# Patient Record
Sex: Male | Born: 1950
Health system: Southern US, Community
[De-identification: ages and names within clinical notes are randomized; demographics above are authoritative.]

## PROBLEM LIST (undated history)

## (undated) DIAGNOSIS — F419 Anxiety disorder, unspecified: Secondary | ICD-10-CM

## (undated) DIAGNOSIS — K7460 Unspecified cirrhosis of liver: Secondary | ICD-10-CM

## (undated) DIAGNOSIS — D649 Anemia, unspecified: Secondary | ICD-10-CM

## (undated) DIAGNOSIS — F32A Depression, unspecified: Secondary | ICD-10-CM

## (undated) DIAGNOSIS — H269 Unspecified cataract: Secondary | ICD-10-CM

## (undated) DIAGNOSIS — N289 Disorder of kidney and ureter, unspecified: Secondary | ICD-10-CM

## (undated) DIAGNOSIS — F1910 Other psychoactive substance abuse, uncomplicated: Secondary | ICD-10-CM

## (undated) DIAGNOSIS — Z5189 Encounter for other specified aftercare: Secondary | ICD-10-CM

## (undated) DIAGNOSIS — C220 Liver cell carcinoma: Secondary | ICD-10-CM

## (undated) DIAGNOSIS — I10 Essential (primary) hypertension: Secondary | ICD-10-CM

## (undated) DIAGNOSIS — A159 Respiratory tuberculosis unspecified: Secondary | ICD-10-CM

## (undated) DIAGNOSIS — M1990 Unspecified osteoarthritis, unspecified site: Secondary | ICD-10-CM

## (undated) DIAGNOSIS — K219 Gastro-esophageal reflux disease without esophagitis: Secondary | ICD-10-CM

## (undated) DIAGNOSIS — C801 Malignant (primary) neoplasm, unspecified: Secondary | ICD-10-CM

## (undated) DIAGNOSIS — K922 Gastrointestinal hemorrhage, unspecified: Secondary | ICD-10-CM

## (undated) DIAGNOSIS — F329 Major depressive disorder, single episode, unspecified: Secondary | ICD-10-CM

## (undated) HISTORY — DX: Respiratory tuberculosis unspecified: A15.9

## (undated) HISTORY — DX: Gastro-esophageal reflux disease without esophagitis: K21.9

## (undated) HISTORY — DX: Major depressive disorder, single episode, unspecified: F32.9

## (undated) HISTORY — PX: THROAT SURGERY: SHX803

## (undated) HISTORY — DX: Malignant (primary) neoplasm, unspecified: C80.1

## (undated) HISTORY — DX: Encounter for other specified aftercare: Z51.89

## (undated) HISTORY — DX: Unspecified osteoarthritis, unspecified site: M19.90

## (undated) HISTORY — DX: Other psychoactive substance abuse, uncomplicated: F19.10

## (undated) HISTORY — DX: Liver cell carcinoma: C22.0

## (undated) HISTORY — DX: Unspecified cataract: H26.9

## (undated) HISTORY — PX: ABDOMINAL SURGERY: SHX537

## (undated) HISTORY — DX: Depression, unspecified: F32.A

## (undated) HISTORY — DX: Essential (primary) hypertension: I10

## (undated) HISTORY — DX: Anxiety disorder, unspecified: F41.9

---

## 2001-10-30 ENCOUNTER — Emergency Department (HOSPITAL_COMMUNITY): Admission: EM | Admit: 2001-10-30 | Discharge: 2001-10-30 | Payer: Self-pay | Admitting: Emergency Medicine

## 2001-10-30 ENCOUNTER — Encounter: Payer: Self-pay | Admitting: Emergency Medicine

## 2008-09-26 DIAGNOSIS — D649 Anemia, unspecified: Secondary | ICD-10-CM | POA: Insufficient documentation

## 2008-09-26 HISTORY — DX: Anemia, unspecified: D64.9

## 2008-09-28 DIAGNOSIS — Z720 Tobacco use: Secondary | ICD-10-CM | POA: Insufficient documentation

## 2008-10-10 DIAGNOSIS — R188 Other ascites: Secondary | ICD-10-CM

## 2008-10-10 DIAGNOSIS — D696 Thrombocytopenia, unspecified: Secondary | ICD-10-CM

## 2008-11-15 DIAGNOSIS — K921 Melena: Secondary | ICD-10-CM

## 2008-11-23 DIAGNOSIS — K2270 Barrett's esophagus without dysplasia: Secondary | ICD-10-CM | POA: Insufficient documentation

## 2009-07-13 DIAGNOSIS — K2980 Duodenitis without bleeding: Secondary | ICD-10-CM

## 2011-01-29 HISTORY — PX: EYE SURGERY: SHX253

## 2012-07-04 DIAGNOSIS — F1010 Alcohol abuse, uncomplicated: Secondary | ICD-10-CM | POA: Insufficient documentation

## 2012-07-04 DIAGNOSIS — E119 Type 2 diabetes mellitus without complications: Secondary | ICD-10-CM | POA: Insufficient documentation

## 2012-07-04 DIAGNOSIS — I8500 Esophageal varices without bleeding: Secondary | ICD-10-CM | POA: Insufficient documentation

## 2013-03-08 ENCOUNTER — Encounter (HOSPITAL_COMMUNITY): Payer: Self-pay | Admitting: Emergency Medicine

## 2013-03-08 ENCOUNTER — Emergency Department (HOSPITAL_COMMUNITY)
Admission: EM | Admit: 2013-03-08 | Discharge: 2013-03-08 | Disposition: A | Discharge status: Home / Self Care | Payer: Medicare HMO | Attending: Emergency Medicine | Admitting: Emergency Medicine

## 2013-03-08 DIAGNOSIS — F17200 Nicotine dependence, unspecified, uncomplicated: Secondary | ICD-10-CM | POA: Insufficient documentation

## 2013-03-08 DIAGNOSIS — K7460 Unspecified cirrhosis of liver: Secondary | ICD-10-CM | POA: Insufficient documentation

## 2013-03-08 DIAGNOSIS — Z79899 Other long term (current) drug therapy: Secondary | ICD-10-CM | POA: Insufficient documentation

## 2013-03-08 DIAGNOSIS — K859 Acute pancreatitis without necrosis or infection, unspecified: Secondary | ICD-10-CM | POA: Insufficient documentation

## 2013-03-08 DIAGNOSIS — Z888 Allergy status to other drugs, medicaments and biological substances status: Secondary | ICD-10-CM | POA: Insufficient documentation

## 2013-03-08 DIAGNOSIS — Z8719 Personal history of other diseases of the digestive system: Secondary | ICD-10-CM | POA: Insufficient documentation

## 2013-03-08 DIAGNOSIS — Z880 Allergy status to penicillin: Secondary | ICD-10-CM | POA: Insufficient documentation

## 2013-03-08 HISTORY — DX: Gastrointestinal hemorrhage, unspecified: K92.2

## 2013-03-08 HISTORY — DX: Unspecified cirrhosis of liver: K74.60

## 2013-03-08 HISTORY — DX: Disorder of kidney and ureter, unspecified: N28.9

## 2013-03-08 LAB — CBC WITH DIFFERENTIAL/PLATELET
Basophils Absolute: 0 10*3/uL (ref 0.0–0.1)
Basophils Relative: 0 % (ref 0–1)
Eosinophils Absolute: 0.5 10*3/uL (ref 0.0–0.7)
Eosinophils Relative: 11 % — ABNORMAL HIGH (ref 0–5)
HCT: 34.4 % — ABNORMAL LOW (ref 39.0–52.0)
Hemoglobin: 12.6 g/dL — ABNORMAL LOW (ref 13.0–17.0)
Lymphocytes Relative: 26 % (ref 12–46)
Lymphs Abs: 1.2 10*3/uL (ref 0.7–4.0)
MCH: 32.5 pg (ref 26.0–34.0)
MCHC: 36.6 g/dL — ABNORMAL HIGH (ref 30.0–36.0)
MCV: 88.7 fL (ref 78.0–100.0)
Monocytes Absolute: 0.4 10*3/uL (ref 0.1–1.0)
Monocytes Relative: 9 % (ref 3–12)
Neutro Abs: 2.6 10*3/uL (ref 1.7–7.7)
Neutrophils Relative %: 54 % (ref 43–77)
Platelets: 95 10*3/uL — ABNORMAL LOW (ref 150–400)
RBC: 3.88 MIL/uL — ABNORMAL LOW (ref 4.22–5.81)
RDW: 14.3 % (ref 11.5–15.5)
WBC: 4.7 10*3/uL (ref 4.0–10.5)

## 2013-03-08 LAB — COMPREHENSIVE METABOLIC PANEL
ALT: 28 U/L (ref 0–53)
AST: 53 U/L — ABNORMAL HIGH (ref 0–37)
Albumin: 3.9 g/dL (ref 3.5–5.2)
Alkaline Phosphatase: 122 U/L — ABNORMAL HIGH (ref 39–117)
BUN: 11 mg/dL (ref 6–23)
CO2: 20 mEq/L (ref 19–32)
Calcium: 10.5 mg/dL (ref 8.4–10.5)
Chloride: 92 mEq/L — ABNORMAL LOW (ref 96–112)
Creatinine, Ser: 1.11 mg/dL (ref 0.50–1.35)
GFR calc Af Amer: 79 mL/min — ABNORMAL LOW (ref >90)
GFR calc non Af Amer: 68 mL/min — ABNORMAL LOW (ref >90)
Glucose, Bld: 107 mg/dL — ABNORMAL HIGH (ref 70–99)
Potassium: 4.8 mEq/L (ref 3.7–5.3)
Sodium: 127 mEq/L — ABNORMAL LOW (ref 137–147)
Total Bilirubin: 0.9 mg/dL (ref 0.3–1.2)
Total Protein: 8.7 g/dL — ABNORMAL HIGH (ref 6.0–8.3)

## 2013-03-08 LAB — URINALYSIS, ROUTINE W REFLEX MICROSCOPIC
Bilirubin Urine: NEGATIVE
Glucose, UA: NEGATIVE mg/dL
Hgb urine dipstick: NEGATIVE
Ketones, ur: NEGATIVE mg/dL
Leukocytes, UA: NEGATIVE
Nitrite: NEGATIVE
Protein, ur: NEGATIVE mg/dL
Specific Gravity, Urine: 1.005 (ref 1.005–1.030)
Urobilinogen, UA: 1 mg/dL (ref 0.0–1.0)
pH: 5.5 (ref 5.0–8.0)

## 2013-03-08 LAB — LIPASE, BLOOD: Lipase: 68 U/L — ABNORMAL HIGH (ref 11–59)

## 2013-03-08 MED ORDER — OXYCODONE HCL 5 MG PO TABS: 5.0000 mg | ORAL_TABLET | Freq: Four times a day (QID) | ORAL | Status: DC | PRN

## 2013-03-08 MED ORDER — ONDANSETRON 4 MG PO TBDP
4.0000 mg | ORAL_TABLET | Freq: Once | ORAL | Status: AC
  Administered 2013-03-08: 4 mg via ORAL
  Filled 2013-03-08: qty 1

## 2013-03-08 MED ORDER — OXYCODONE-ACETAMINOPHEN 5-325 MG PO TABS
2.0000 | ORAL_TABLET | Freq: Once | ORAL | Status: AC
  Administered 2013-03-08: 2 via ORAL
  Filled 2013-03-08: qty 2

## 2013-03-08 NOTE — Discharge Instructions (Signed)
Acute Pancreatitis °Acute pancreatitis is a disease in which the pancreas becomes suddenly inflamed. The pancreas is a large gland located behind your stomach. The pancreas produces enzymes that help digest food. The pancreas also releases the hormones glucagon and insulin that help regulate blood sugar. Damage to the pancreas occurs when the digestive enzymes from the pancreas are activated and begin attacking the pancreas before being released into the intestine. Most acute attacks last a couple of days and can cause serious complications. Some people become dehydrated and develop low blood pressure. In severe cases, bleeding into the pancreas can lead to shock and can be life-threatening. The lungs, heart, and kidneys may fail. °CAUSES  °Pancreatitis can happen to anyone. In some cases, the cause is unknown. Most cases are caused by: °· Alcohol abuse. °· Gallstones. °Other less common causes are: °· Certain medicines. °· Exposure to certain chemicals. °· Infection. °· Damage caused by an accident (trauma). °· Abdominal surgery. °SYMPTOMS  °· Pain in the upper abdomen that may radiate to the back. °· Tenderness and swelling of the abdomen. °· Nausea and vomiting. °DIAGNOSIS  °Your caregiver will perform a physical exam. Blood and stool tests may be done to confirm the diagnosis. Imaging tests may also be done, such as X-rays, CT scans, or an ultrasound of the abdomen. °TREATMENT  °Treatment usually requires a stay in the hospital. Treatment may include: °· Pain medicine. °· Fluid replacement through an intravenous line (IV). °· Placing a tube in the stomach to remove stomach contents and control vomiting. °· Not eating for 3 or 4 days. This gives your pancreas a rest, because enzymes are not being produced that can cause further damage. °· Antibiotic medicines if your condition is caused by an infection. °· Surgery of the pancreas or gallbladder. °HOME CARE INSTRUCTIONS  °· Follow the diet advised by your  caregiver. This may involve avoiding alcohol and decreasing the amount of fat in your diet. °· Eat smaller, more frequent meals. This reduces the amount of digestive juices the pancreas produces. °· Drink enough fluids to keep your urine clear or pale yellow. °· Only take over-the-counter or prescription medicines as directed by your caregiver. °· Avoid drinking alcohol if it caused your condition. °· Do not smoke. °· Get plenty of rest. °· Check your blood sugar at home as directed by your caregiver. °· Keep all follow-up appointments as directed by your caregiver. °SEEK MEDICAL CARE IF:  °· You do not recover as quickly as expected. °· You develop new or worsening symptoms. °· You have persistent pain, weakness, or nausea. °· You recover and then have another episode of pain. °SEEK IMMEDIATE MEDICAL CARE IF:  °· You are unable to eat or keep fluids down. °· Your pain becomes severe. °· You have a fever or persistent symptoms for more than 2 to 3 days. °· You have a fever and your symptoms suddenly get worse. °· Your skin or the white part of your eyes turn yellow (jaundice). °· You develop vomiting. °· You feel dizzy, or you faint. °· Your blood sugar is high (over 300 mg/dL). °MAKE SURE YOU:  °· Understand these instructions. °· Will watch your condition. °· Will get help right away if you are not doing well or get worse. °Document Released: 01/14/2005 Document Revised: 07/16/2011 Document Reviewed: 04/25/2011 °ExitCare® Patient Information ©2014 [employer]. ° °

## 2013-03-08 NOTE — ED Notes (Addendum)
C/o lower mid abd pain, 8/10, onset 2d ago, also nausea. Last BM yesterday (normal), (denies: fever, diarrhea or vomiting), h/o cirrhosis & CKD. Here with family. walkes with cane. H/o GI bleed.

## 2013-03-08 NOTE — ED Notes (Signed)
Pt given a ginger ale for PO fluid challenge per EDP verbal orders.

## 2013-03-08 NOTE — ED Provider Notes (Signed)
CSN: [id number]     Arrival date & time 03/08/13  0025 History   First MD Initiated Contact with Patient 03/08/13 [phone]     Chief Complaint  Patient presents with  . Abdominal Pain   (Consider location/radiation/quality/duration/timing/severity/associated sxs/prior Treatment) Patient is a 63 y.o. male presenting with abdominal pain. The history is provided by the patient.  Abdominal Pain Pain location:  Suprapubic Pain quality: aching   Pain radiates to:  Does not radiate Pain severity:  Moderate Onset quality:  Gradual Duration:  2 days Timing:  Constant Progression:  Unchanged Context: not awakening from sleep   Relieved by:  Nothing Worsened by:  Nothing tried Ineffective treatments:  None tried Associated symptoms: no anorexia, no hematuria, no nausea, no shortness of breath and no vomiting   Risk factors: not pregnant     Past Medical History  Diagnosis Date  . Cirrhosis   . Renal disorder   . GI bleed    Past Surgical History  Procedure Laterality Date  . Abdominal surgery      band around gut at [facility]   No family history on file. History  Substance Use Topics  . Smoking status: Current Some Day Smoker  . Smokeless tobacco: Not on file  . Alcohol Use: Yes    Review of Systems  Respiratory: Negative for shortness of breath.   Gastrointestinal: Positive for abdominal pain. Negative for nausea, vomiting and anorexia.  Genitourinary: Negative for hematuria.  All other systems reviewed and are negative.    Allergies  Penicillins and Tylenol  Home Medications   Current Outpatient Rx  Name  Route  Sig  Dispense  Refill  . furosemide (LASIX) 40 MG tablet   Oral   Take 1 mg by mouth daily.          Marland Kitchen omeprazole (PRILOSEC) 20 MG capsule   Oral   Take 20 mg by mouth daily.          . propranolol (INDERAL) 10 MG tablet   Oral   Take 1 tablet by mouth 2 (two) times daily.         Marland Kitchen spironolactone (ALDACTONE) 50 MG tablet   Oral   Take 1  tablet by mouth 2 (two) times daily.          BP 98/61  Pulse 60  Temp(Src) 97.7 F (36.5 C) (Oral)  Resp 18  SpO2 100% Physical Exam  Constitutional: He is oriented to person, place, and time. He appears well-developed and well-nourished. No distress.  HENT:  Head: Normocephalic and atraumatic.  Mouth/Throat: Oropharynx is clear and moist.  Eyes: Conjunctivae are normal. Pupils are equal, round, and reactive to light.  Neck: Normal range of motion. Neck supple.  Cardiovascular: Normal rate, regular rhythm and intact distal pulses.   Pulmonary/Chest: Effort normal and breath sounds normal. He has no wheezes. He has no rales.  Abdominal: Soft. Bowel sounds are increased. There is no tenderness. There is no guarding.  Normal appearance  Musculoskeletal: Normal range of motion.  Neurological: He is alert and oriented to person, place, and time.  Skin: Skin is warm and dry.  Psychiatric: He has a normal mood and affect.    ED Course  Procedures (including critical care time) Labs Review Labs Reviewed  CBC WITH DIFFERENTIAL - Abnormal; Notable for the following:    RBC 3.88 (*)    Hemoglobin 12.6 (*)    HCT 34.4 (*)    MCHC 36.6 (*)    Platelets  95 (*)    Eosinophils Relative 11 (*)    All other components within normal limits  COMPREHENSIVE METABOLIC PANEL - Abnormal; Notable for the following:    Sodium 127 (*)    Chloride 92 (*)    Glucose, Bld 107 (*)    Total Protein 8.7 (*)    AST 53 (*)    Alkaline Phosphatase 122 (*)    GFR calc non Af Amer 68 (*)    GFR calc Af Amer 79 (*)    All other components within normal limits  LIPASE, BLOOD - Abnormal; Notable for the following:    Lipase 68 (*)    All other components within normal limits  URINALYSIS, ROUTINE W REFLEX MICROSCOPIC   Imaging Review No results found.  EKG Interpretation   None       MDM  No diagnosis found. Slight elevation of lipase will treat as outpatient for pancreatitis.  Holding down  PO.  Will d/c with pain medication.  No indication for CT at this time.  Follow up with your PMD and GI return for worsening symptoms    Rylinn Linzy K Devann Cribb-Rasch, MD 03/08/13 [phone]

## 2013-11-29 DIAGNOSIS — F419 Anxiety disorder, unspecified: Secondary | ICD-10-CM | POA: Insufficient documentation

## 2014-01-03 DIAGNOSIS — F329 Major depressive disorder, single episode, unspecified: Secondary | ICD-10-CM | POA: Insufficient documentation

## 2014-01-03 DIAGNOSIS — F32A Depression, unspecified: Secondary | ICD-10-CM | POA: Insufficient documentation

## 2014-07-05 NOTE — Patient Outreach (Signed)
[place] [facility]) Care Management  07/05/2014  Rigoberto Repass [birth year] [id number]   [employer] [facility] [employer] RN Collier Salina Gord) phoned [facility] requesting [facility] outreach patient for care, assigned Quinn Plowman, RN  Ronnell [place]. [place], [place] Management [place] Assistant Phone: [phone] Fax: [phone]

## 2014-07-13 ENCOUNTER — Other Ambulatory Visit: Payer: Self-pay

## 2014-07-13 NOTE — Patient Outreach (Signed)
[place] [facility]) Care Management  07/13/2014  Antonio Fleming [birth year] [id number]   Telephone call to patient/ daughter regarding [facility] referral .  Unable to reach.  HIPAA compliant voice message left with call back phone number. Telephone call to Dr. Jonathon Jordan office to verify patients home disposition.  Left message with nurse for call back. Telephone call to [name] with [facility] at home requesting call back to [facility].   PLAN; RNCM will attempt 2nd telephone outreach with 1 week.  Quinn Plowman RN,BSN,CCM [place] Coordinator [phone]

## 2014-07-13 NOTE — Patient Outreach (Signed)
[place] [facility]) Care Management  07/13/2014  Antonio Fleming [birth year] [id number]   Received return call from [name] with [employer] at [employer].  [name] states he previously followed patient for care coordination.  States last time speaking with Mr. Champine was July 05, 2014.  [name] gave RNCM patients new contact phone number:  [phone].  [name] attempted to reach patient while on the line with this RNCM.  Unable to reach patient and HIPAA compliant voice message left.  PLAN: RNCM will attempt to contact patient at new number within 1 week. RNCM will notify Lurline Del with [facility] care management of patient new number and request update in EPIC system.  Quinn Plowman RN,BSN,CCM [place] Coordinator [phone]

## 2014-07-14 ENCOUNTER — Other Ambulatory Visit: Payer: Self-pay

## 2014-07-14 NOTE — Patient Outreach (Signed)
[place] [facility]) Care Management  07/14/2014  Chandlar Guice 1950-05-01 [id number]   Voice message received from [facility] Smart family medicine at [facility] stating patient has not been seen iin their office for quite a while.  Ms. Alroy Dust states patient is not being seen at their office.   PLAN; No further follow up needed at this primary MD office.   Quinn Plowman RN,BSN,CCM [place] Coordinator [phone]

## 2014-07-18 ENCOUNTER — Other Ambulatory Visit: Payer: Self-pay

## 2014-07-18 NOTE — Patient Outreach (Addendum)
[place] [facility]) Care Management  07/18/2014  Antonio Fleming 06/26/1950 [id number]  SUBJECTIVE:  Telephone call to patient regarding [employer] [facility] [employer] referral.  HIPAA verified with patient.  Patient states he was recently discharged from [facility] rehabilitation facility. Patient states he fell.  States he was admitted to [facility] then transferred to [facility].  Patient unable to provide any additional detail.  Patient states he followed up with a doctor in [place] but will not be going to him for primary care.  Patient states he is going to call [facility] primary care to see if they are accepting new patients.  Patient states he was given a refill on his medications at discharge from [facility].  Patient states he is presently taking pain medication for liver cancer.  Patient states he is not followed by an oncologist. Patient states he is also taking a fluid pill but does not know why. Patient states he is able to obtain and afford his medications.  Patient in agreement to receive call back from this [facility] for follow up regarding primary MD appointment.   ASSESSMENT;  Referral per [name] with [facility] at Home.  Difficulty hearing patient on his phone.  Requested patient to repeat himself on several occassions. Patient poor historian.  Patient unable to articulate any medical conditions other than cancer.   PLAN: RNCM will follow up with patient within 3 business days.  Quinn Plowman RN,BSN,CCM [place] Coordinator [phone]

## 2014-07-20 ENCOUNTER — Other Ambulatory Visit: Payer: Self-pay

## 2014-07-20 NOTE — Patient Outreach (Signed)
[place] [facility]) Care Management  07/20/2014  Antonio Fleming Jun 15, 1950 [id number]   SUBJECTIVE;  Telephone call to patient for screening follow up.  HIPAA confirmed with patient.  Patient states she was unsuccessful at finding a primary MD.  This RNCM gave patient phone number to physician referral line and requested patient to call and inquire of new primary MD.   [facility] requested patient call back to notify of physician arrangements.  Patient verbalized understanding. Patient states previous physician he went to was after discharge from skilled nursing facility 2016.  Patient states he Patient states prevous physician had concern due to his sobriety issues. Patient reports he is still drinking beer. Patient states he has issues with transportation. Patient states he has to "bumb" a ride wherever he goes. Patient states he has liver cancer but is unaware of how it has progressed. Patient states he has not been to an oncologist in over a year.  Patient unable to states name of oncologist.  Patient states he has problems with his stomach and with bleeding due to his stomach issues.   ASSESSMENT: Care coordination assistance   PLAN:  RNCM will call patient within 1 day to assist with further needs. RNCM gave patient contact phone number to physician referral line. Patient will call physician referral line to establish a new primary provider.    Quinn Plowman RN,BSN,CCM [place] Coordinator [phone]

## 2014-07-21 ENCOUNTER — Other Ambulatory Visit: Payer: Commercial Managed Care - HMO

## 2014-07-21 NOTE — Patient Outreach (Signed)
[place] [facility]) Care Management  07/21/2014  Antonio Fleming 05/31/1950 [id number]   Telephone call to patient regarding primary MD referral.  Unable to reach patient. HIPAA compliant voice message left with call back phone number.   PLAN:  RNCM will attempt outreach to patient within 3 business days.   Quinn Plowman RN,BSN,CCM [place] Coordinator [phone]

## 2014-07-25 ENCOUNTER — Other Ambulatory Visit: Payer: Commercial Managed Care - HMO

## 2014-07-25 NOTE — Patient Outreach (Signed)
[facility]) Care Management  07/25/2014  Babe Clenney [birth year] [id number]   Second telephone outreach to patient regarding primary MD referral.  Unable to reach patient. HIPAA compliant voice message left with call back phone number.   PLAN: RNCM will attempt 3rd telephone outreach to patient within 2 business days.   Quinn Plowman RN,BSN,CCM [place] Coordinator [phone]

## 2014-07-26 ENCOUNTER — Other Ambulatory Visit: Payer: Commercial Managed Care - HMO

## 2014-07-26 NOTE — Patient Outreach (Signed)
[place] [facility]) Care Management  07/26/2014  Dorris Vangorder 1950/05/12 [id number]   Third telephone call to patient regarding [employer] referral.  Unable to reach patient. HIPAA compliant voice message left with call back phone number.  PLAN: RNCM will send patient outreach letter to attempt contact.  Quinn Plowman RN,BSN,CCM [place] Coordinator [phone]

## 2014-08-02 ENCOUNTER — Other Ambulatory Visit: Payer: Commercial Managed Care - HMO

## 2014-08-02 NOTE — Patient Outreach (Addendum)
[place] [facility]) Care Management  08/02/2014  Terrance Usery 05/22/1950 [id number]  Voice message left from patient that he has scheduled an appointment with Dr. Mackie Pai on 08/03/14. Telephone call to patient for referral follow up.  Unable to reach patient. HIPAA compliant voice message left with call back phone number.   PLAN: RNCM will attempt telephone outreach to patient within 3 business days. * addendum:  RNCM will send outreach letter to attempt contact with patient.  No additional telephone outreaches needed at this time.   Quinn Plowman RN,BSN,CCM [place] Coordinator [phone]

## 2014-08-03 ENCOUNTER — Encounter: Payer: Self-pay | Admitting: Medical

## 2014-08-03 ENCOUNTER — Ambulatory Visit (INDEPENDENT_AMBULATORY_CARE_PROVIDER_SITE_OTHER): Payer: Commercial Managed Care - HMO | Admitting: Medical

## 2014-08-03 VITALS — BP 119/71 | HR 63 | Temp 97.5°F | Ht 65.75 in | Wt 118.6 lb

## 2014-08-03 DIAGNOSIS — C229 Malignant neoplasm of liver, not specified as primary or secondary: Secondary | ICD-10-CM | POA: Insufficient documentation

## 2014-08-03 DIAGNOSIS — K7460 Unspecified cirrhosis of liver: Secondary | ICD-10-CM | POA: Insufficient documentation

## 2014-08-03 DIAGNOSIS — Z8619 Personal history of other infectious and parasitic diseases: Secondary | ICD-10-CM | POA: Diagnosis not present

## 2014-08-03 DIAGNOSIS — G894 Chronic pain syndrome: Secondary | ICD-10-CM | POA: Diagnosis not present

## 2014-08-03 DIAGNOSIS — C228 Malignant neoplasm of liver, primary, unspecified as to type: Secondary | ICD-10-CM | POA: Diagnosis not present

## 2014-08-03 DIAGNOSIS — K7469 Other cirrhosis of liver: Secondary | ICD-10-CM

## 2014-08-03 MED ORDER — OXYCODONE HCL 5 MG PO TABS: 5.0000 mg | ORAL_TABLET | Freq: Four times a day (QID) | ORAL | Status: DC | PRN

## 2014-08-03 NOTE — Assessment & Plan Note (Signed)
Ask pt to get me name of oncologist so I can get records and make sure he is referred back to GI in timely manner.

## 2014-08-03 NOTE — Progress Notes (Signed)
Subjective:    Patient ID: Antonio Fleming, male    DOB: [birth year], 64 y.o.   MRN: [id number]  HPI  I have reviewed pt PMH, PSH, FH, Social History and Surgical History.  Pt does not have primary care doctor- Pt could not understand prior pcp. States former [place] not from [place]   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  Alcohol use- 3 beers at a time. 24 beers spread out over a month. But prior drank more/heavy. Pt has cancer of the liver(Pt states he was told this 15 months ago). Pt has malignant neoplasm of liver.  Before this he had cirrhosis of the liver. Pt told he could get transplant if he stopped drinkig for one year. But he never stopped.  Pt has some /suprapubic pain and abdomen pain all the time. Moderate. But not describing any acute abdomen type pain today. Describes this pain as daily and not new.   Depression - in past but controlled presently  Htn- controlled presently. He is on lasix, inderal, and aldactone.  Gerd- Pt is on omeprazole. Hx of barrets espophagitis.  History of hepatitis C.  Pt ran out of his last pain medication yesterday 12 noon.        Review of Systems  Constitutional: Negative for fever, chills, diaphoresis, activity change and fatigue.  Respiratory: Negative for cough, chest tightness and shortness of breath.   Cardiovascular: Negative for chest pain, palpitations and leg swelling.  Gastrointestinal: Negative for nausea, vomiting and abdominal pain.       Diffuse chronic abdominal pain. Pt states with hx of liver ca.Doctor at [facility] and then at nursing home gave him oxycodone. Pt states not sure if he has metastisis.  Musculoskeletal: Negative for neck pain and neck stiffness.  Neurological: Negative for dizziness, tremors, seizures, syncope, facial asymmetry, speech difficulty, weakness, light-headedness, numbness and headaches.  Psychiatric/Behavioral: Negative for behavioral problems,  confusion and agitation. The patient is not nervous/anxious.    Past Medical History  Diagnosis Date  . Cirrhosis   . Renal disorder   . GI bleed   . Anxiety   . Arthritis   . Blood transfusion without reported diagnosis   . Cancer   . Cataract   . Depression   . GERD (gastroesophageal reflux disease)   . Hypertension   . Substance abuse   . Tuberculosis     History   Social History  . Marital Status: Divorced    Spouse Name: N/A  . Number of Children: N/A  . Years of Education: N/A   Occupational History  . Not on file.   Social History Main Topics  . Smoking status: Current Some Day Smoker  . Smokeless tobacco: Never Used  . Alcohol Use: 0.0 oz/week    0 Standard drinks or equivalent per week  . Drug Use: No  . Sexual Activity: Not on file   Other Topics Concern  . Not on file   Social History Narrative    Past Surgical History  Procedure Laterality Date  . Abdominal surgery      band around gut at [facility]  . Throat surgery      Family History  Problem Relation Age of Onset  . Heart disease Mother   . Cancer Father     Allergies  Allergen Reactions  . Codeine Other (See Comments)    Sweating  . Penicillins     N/V  . Tylenol [  Acetaminophen]     Liver problems    Current Outpatient Prescriptions on File Prior to Visit  Medication Sig Dispense Refill  . furosemide (LASIX) 40 MG tablet Take 1 mg by mouth daily.     Marland Kitchen omeprazole (PRILOSEC) 20 MG capsule Take 20 mg by mouth daily.     . propranolol (INDERAL) 10 MG tablet Take 1 tablet by mouth 2 (two) times daily.    Marland Kitchen spironolactone (ALDACTONE) 50 MG tablet Take 1 tablet by mouth 2 (two) times daily.     No current facility-administered medications on file prior to visit.    BP 119/71 mmHg  Pulse 63  Temp(Src) 97.5 F (36.4 C) (Oral)  Ht 5' 5.75" (1.67 m)  Wt 118 lb 9.6 oz (53.797 kg)  BMI 19.29 kg/m2  SpO2 100%       Objective:   Physical Exam  General Appearance- Not in  acute distress. Frail appearing and in wheel chair but no acute distress.  HEENT Eyes- Scleraeral/Conjuntiva-bilat- Not Yellow. Mouth & Throat- Normal.  Chest and Lung Exam Auscultation: Breath sounds:-Normal.CTA. Adventitious sounds:- No Adventitious sounds.  Cardiovascular Auscultation:Rythm - Regular. Heart Sounds -Normal heart sounds.  Abdomen Inspection:-Inspection Normal.  Palpation/Perucssion: Palpation and Percussion of the abdomen reveal- Non Tender, No Rebound tenderness, No rigidity(Guarding) and No Palpable abdominal masses.  Liver:-Normal.  Spleen:- Normal.  No obvious ascites.  Back- no cva tenderness       Assessment & Plan:

## 2014-08-03 NOTE — Assessment & Plan Note (Signed)
7 days rx of oxycodone due to cancer and related pain dx. But do want to get better understanding before writing further pain meds.  After reviewing labs and when I get records if no evidence of metastasis then will consider giving hydrocodone.

## 2014-08-03 NOTE — Assessment & Plan Note (Signed)
Pt to get me name of oncologist. So can understand extent of his cancer. And make sure follows up timely manner.

## 2014-08-03 NOTE — Progress Notes (Signed)
Pre visit review using our clinic review tool, if applicable. No additional management support is needed unless otherwise documented below in the visit note. 

## 2014-08-03 NOTE — Assessment & Plan Note (Signed)
Pt is new to office with hx of cirrhosis. So will order cbc, cmp, ggt today.

## 2014-08-03 NOTE — Patient Instructions (Addendum)
Pt is new to office with hx of cirrhosis, hep c, and liver cancer. Chronic pain as well thorax, pelvic region, abdomen reported.   Unclear if has metastis. Cancer for 15 month dx.   So will order cbc, cmp, ggt today.  Follow up in 7 days. Please get names of oncologist and [place] MD.  7 days rx of oxycodone due to cancer and related pain dx. But do want to get better understanding before writing further pain meds.

## 2014-08-04 LAB — COMPREHENSIVE METABOLIC PANEL
ALBUMIN: 3.5 g/dL (ref 3.5–5.2)
ALK PHOS: 80 U/L (ref 39–117)
ALT: 14 U/L (ref 0–53)
AST: 20 U/L (ref 0–37)
BUN: 22 mg/dL (ref 6–23)
CO2: 22 mEq/L (ref 19–32)
Calcium: 11.3 mg/dL — ABNORMAL HIGH (ref 8.4–10.5)
Chloride: 114 mEq/L — ABNORMAL HIGH (ref 96–112)
Creatinine, Ser: 1.09 mg/dL (ref 0.40–1.50)
GFR: 71.97 mL/min (ref >60.00)
GLUCOSE: 97 mg/dL (ref 70–99)
POTASSIUM: 4.8 meq/L (ref 3.5–5.1)
SODIUM: 141 meq/L (ref 135–145)
Total Bilirubin: 0.9 mg/dL (ref 0.2–1.2)
Total Protein: 7.7 g/dL (ref 6.0–8.3)

## 2014-08-04 LAB — CBC WITH DIFFERENTIAL/PLATELET
BASOS PCT: 0.5 % (ref 0.0–3.0)
Basophils Absolute: 0 10*3/uL (ref 0.0–0.1)
EOS PCT: 11.3 % — AB (ref 0.0–5.0)
Eosinophils Absolute: 0.5 10*3/uL (ref 0.0–0.7)
HCT: 27.6 % — ABNORMAL LOW (ref 39.0–52.0)
Hemoglobin: 9.3 g/dL — ABNORMAL LOW (ref 13.0–17.0)
Lymphocytes Relative: 14.1 % (ref 12.0–46.0)
Lymphs Abs: 0.6 10*3/uL — ABNORMAL LOW (ref 0.7–4.0)
MCHC: 33.8 g/dL (ref 30.0–36.0)
MCV: 94.7 fl (ref 78.0–100.0)
MONOS PCT: 5 % (ref 3.0–12.0)
Monocytes Absolute: 0.2 10*3/uL (ref 0.1–1.0)
NEUTROS PCT: 69.1 % (ref 43.0–77.0)
Neutro Abs: 2.9 10*3/uL (ref 1.4–7.7)
PLATELETS: 81 10*3/uL — AB (ref 150.0–400.0)
RBC: 2.91 Mil/uL — AB (ref 4.22–5.81)
RDW: 16.7 % — ABNORMAL HIGH (ref 11.5–15.5)
WBC: 4.2 10*3/uL (ref 4.0–10.5)

## 2014-08-04 LAB — GAMMA GT: GGT: 37 U/L (ref 7–51)

## 2014-08-04 NOTE — Progress Notes (Signed)
This encounter was created in error - please disregard.

## 2014-08-05 ENCOUNTER — Telehealth: Payer: Self-pay | Admitting: Medical

## 2014-08-05 DIAGNOSIS — D649 Anemia, unspecified: Secondary | ICD-10-CM

## 2014-08-05 DIAGNOSIS — C228 Malignant neoplasm of liver, primary, unspecified as to type: Secondary | ICD-10-CM

## 2014-08-05 MED ORDER — FERROUS SULFATE 325 (65 FE) MG PO TABS: 325.0000 mg | ORAL_TABLET | Freq: Every day | ORAL | Status: DC

## 2014-08-05 NOTE — Telephone Encounter (Signed)
Pt daughter, Brayton Layman, returning your call. Her # is [phone].

## 2014-08-09 ENCOUNTER — Telehealth: Payer: Self-pay | Admitting: Emergency Medicine

## 2014-08-09 NOTE — Telephone Encounter (Signed)
Spoke with patient; gave him new appointment date and times.

## 2014-08-10 ENCOUNTER — Encounter: Payer: Self-pay | Admitting: Medical

## 2014-08-10 ENCOUNTER — Ambulatory Visit (INDEPENDENT_AMBULATORY_CARE_PROVIDER_SITE_OTHER): Payer: Commercial Managed Care - HMO | Admitting: Medical

## 2014-08-10 VITALS — BP 119/67 | HR 55 | Temp 98.6°F | Ht 65.75 in | Wt 125.8 lb

## 2014-08-10 DIAGNOSIS — G894 Chronic pain syndrome: Secondary | ICD-10-CM | POA: Diagnosis not present

## 2014-08-10 DIAGNOSIS — D649 Anemia, unspecified: Secondary | ICD-10-CM | POA: Diagnosis not present

## 2014-08-10 LAB — CBC WITH DIFFERENTIAL/PLATELET
Basophils Absolute: 0 10*3/uL (ref 0.0–0.1)
Basophils Relative: 0.2 % (ref 0.0–3.0)
Eosinophils Absolute: 0.5 10*3/uL (ref 0.0–0.7)
Eosinophils Relative: 12.8 % — ABNORMAL HIGH (ref 0.0–5.0)
Hemoglobin: 8.7 g/dL — ABNORMAL LOW (ref 13.0–17.0)
LYMPHS PCT: 14.5 % (ref 12.0–46.0)
Lymphs Abs: 0.5 10*3/uL — ABNORMAL LOW (ref 0.7–4.0)
MCHC: 33.8 g/dL (ref 30.0–36.0)
MCV: 96.9 fl (ref 78.0–100.0)
Monocytes Absolute: 0.3 10*3/uL (ref 0.1–1.0)
Monocytes Relative: 7.7 % (ref 3.0–12.0)
NEUTROS ABS: 2.4 10*3/uL (ref 1.4–7.7)
NEUTROS PCT: 64.8 % (ref 43.0–77.0)
PLATELETS: 83 10*3/uL — AB (ref 150.0–400.0)
RDW: 17 % — AB (ref 11.5–15.5)
WBC: 3.6 10*3/uL — AB (ref 4.0–10.5)

## 2014-08-10 MED ORDER — HYDROCODONE-IBUPROFEN 5-200 MG PO TABS: 1.0000 | ORAL_TABLET | Freq: Three times a day (TID) | ORAL | Status: DC | PRN

## 2014-08-10 NOTE — Patient Instructions (Addendum)
Will get cbc today stat and anemia panel studies.  Pt to see Dr. Marin Olp next week.  Chronic pain syndrome I don't have old records yet. He will [place] his oxycodone until he finishes his remaining tabs. Then when he finishes oxycodone he will start vicoprofen.   I don't have old records. He does not describe severe pain. Once get old record, if pain increases or metastisis of ca may put him back on oxycodone.    Turn in stool cards next week when in. Today one card in office was negative.,  Follow 2 wks with me or as needed.

## 2014-08-10 NOTE — Assessment & Plan Note (Addendum)
I don't have old records yet. He will [place] his oxycodone until he finishes his remaining tabs. Then when he finishes oxycodone he will start vicoprofen.   I don't have old records. He does not describe severe pain. Once get old record, if pain increases or metastisis of ca may put him back on oxycodone.

## 2014-08-10 NOTE — Progress Notes (Signed)
Pre visit review using our clinic review tool, if applicable. No additional management support is needed unless otherwise documented below in the visit note. 

## 2014-08-10 NOTE — Telephone Encounter (Signed)
Orders placed in chart

## 2014-08-10 NOTE — Progress Notes (Signed)
Subjective:    Patient ID: Antonio Fleming, male    DOB: 1950-06-13, 64 y.o.   MRN: [id number]  HPI  Pt in for follow up. Hx of substance abuse, alcohol use, suprapubic pain, htn and hepatitis C. Also cancer of liver. Pt has appointment with Dr. Marin Olp next week.  I wanted pt to follow up due to anemia. Pt had drop in hb since form  last yr. So I wanted him to come in to recheck cbc, get anemia panel and turn in cards to check for stools but he did not turn those in.  Review of Systems  Constitutional: Positive for fatigue. Negative for fever, chills, diaphoresis and activity change.       Mild.  Respiratory: Negative for cough, chest tightness and shortness of breath.   Cardiovascular: Negative for chest pain, palpitations and leg swelling.  Gastrointestinal: Negative for nausea, vomiting and abdominal pain.  Musculoskeletal: Negative for neck pain and neck stiffness.  Neurological: Negative for dizziness, tremors, seizures, syncope, facial asymmetry, speech difficulty, weakness, light-headedness, numbness and headaches.  Psychiatric/Behavioral: Negative for behavioral problems, confusion and agitation. The patient is not nervous/anxious.      Past Medical History  Diagnosis Date  . Cirrhosis   . Renal disorder   . GI bleed   . Anxiety   . Arthritis   . Blood transfusion without reported diagnosis   . Cancer   . Cataract   . Depression   . GERD (gastroesophageal reflux disease)   . Hypertension   . Substance abuse   . Tuberculosis     History   Social History  . Marital Status: Divorced    Spouse Name: N/A  . Number of Children: N/A  . Years of Education: N/A   Occupational History  . Not on file.   Social History Main Topics  . Smoking status: Current Some Day Smoker  . Smokeless tobacco: Never Used  . Alcohol Use: 0.0 oz/week    0 Standard drinks or equivalent per week  . Drug Use: No  . Sexual Activity: Not on file   Other Topics Concern  . Not on file    Social History Narrative    Past Surgical History  Procedure Laterality Date  . Abdominal surgery      band around gut at [facility]  . Throat surgery      Family History  Problem Relation Age of Onset  . Heart disease Mother   . Cancer Father     Allergies  Allergen Reactions  . Codeine Other (See Comments)    Sweating  . Penicillins     N/V  . Tylenol [Acetaminophen]     Liver problems    Current Outpatient Prescriptions on File Prior to Visit  Medication Sig Dispense Refill  . ferrous sulfate 325 (65 FE) MG tablet Take 1 tablet (325 mg total) by mouth daily with breakfast. 30 tablet 2  . furosemide (LASIX) 40 MG tablet Take 1 mg by mouth daily.     Marland Kitchen omeprazole (PRILOSEC) 20 MG capsule Take 20 mg by mouth daily.     Marland Kitchen oxyCODONE (ROXICODONE) 5 MG immediate release tablet Take 1 tablet (5 mg total) by mouth every 6 (six) hours as needed for severe pain. 28 tablet 0  . propranolol (INDERAL) 10 MG tablet Take 1 tablet by mouth 2 (two) times daily.    Marland Kitchen spironolactone (ALDACTONE) 50 MG tablet Take 1 tablet by mouth 2 (two) times daily.     No  current facility-administered medications on file prior to visit.    BP 119/67 mmHg  Pulse 55  Temp(Src) 98.6 F (37 C) (Oral)  Ht 5' 5.75" (1.67 m)  Wt 125 lb 12.8 oz (57.063 kg)  BMI 20.46 kg/m2  SpO2 100%        Objective:   Physical Exam  General- No acute distress. Pleasant patient. Neck- Full range of motion, no jvd Lungs- Clear, even and unlabored. Heart- regular rate and rhythm. Neurologic- CNII- XII grossly intact.   Abdomen Inspection:-Inspection Normal.  Palpation/Perucssion: Palpation and Percussion of the abdomen reveal- Non Tender, No Rebound tenderness, No rigidity(Guarding) and No Palpable abdominal masses.  Liver:-Normal.  Spleen:- Normal.   Rectal Anorectal Exam: Stool - Hemoccult of stool/mucous is Heme Negative. External - normal external exam. Internal - normal sphincter tone. No rectal  mass.       Assessment & Plan:

## 2014-08-17 ENCOUNTER — Telehealth: Payer: Self-pay

## 2014-08-17 NOTE — Telephone Encounter (Signed)
LVM to call back and confirm appt for 08/18/14

## 2014-08-18 ENCOUNTER — Ambulatory Visit: Payer: Commercial Managed Care - HMO

## 2014-08-18 ENCOUNTER — Ambulatory Visit (HOSPITAL_BASED_OUTPATIENT_CLINIC_OR_DEPARTMENT_OTHER): Payer: Commercial Managed Care - HMO | Admitting: Family

## 2014-08-18 ENCOUNTER — Ambulatory Visit (HOSPITAL_BASED_OUTPATIENT_CLINIC_OR_DEPARTMENT_OTHER): Payer: Commercial Managed Care - HMO

## 2014-08-18 ENCOUNTER — Encounter: Payer: Self-pay | Admitting: Family

## 2014-08-18 DIAGNOSIS — D649 Anemia, unspecified: Secondary | ICD-10-CM | POA: Diagnosis not present

## 2014-08-18 DIAGNOSIS — B1920 Unspecified viral hepatitis C without hepatic coma: Secondary | ICD-10-CM | POA: Diagnosis not present

## 2014-08-18 DIAGNOSIS — K7030 Alcoholic cirrhosis of liver without ascites: Secondary | ICD-10-CM

## 2014-08-18 DIAGNOSIS — Z8619 Personal history of other infectious and parasitic diseases: Secondary | ICD-10-CM

## 2014-08-18 DIAGNOSIS — D500 Iron deficiency anemia secondary to blood loss (chronic): Secondary | ICD-10-CM

## 2014-08-18 DIAGNOSIS — K7031 Alcoholic cirrhosis of liver with ascites: Secondary | ICD-10-CM | POA: Diagnosis not present

## 2014-08-18 DIAGNOSIS — C227 Other specified carcinomas of liver: Secondary | ICD-10-CM

## 2014-08-18 DIAGNOSIS — C220 Liver cell carcinoma: Secondary | ICD-10-CM

## 2014-08-18 LAB — COMPREHENSIVE METABOLIC PANEL
ALT: 14 U/L (ref 0–53)
AST: 21 U/L (ref 0–37)
Albumin: 3.1 g/dL — ABNORMAL LOW (ref 3.5–5.2)
Alkaline Phosphatase: 61 U/L (ref 39–117)
BUN: 26 mg/dL — AB (ref 6–23)
CALCIUM: 10.7 mg/dL — AB (ref 8.4–10.5)
CO2: 19 mEq/L (ref 19–32)
CREATININE: 1.2 mg/dL (ref 0.50–1.35)
Chloride: 115 mEq/L — ABNORMAL HIGH (ref 96–112)
Glucose, Bld: 123 mg/dL — ABNORMAL HIGH (ref 70–99)
Potassium: 4.6 mEq/L (ref 3.5–5.3)
SODIUM: 141 meq/L (ref 135–145)
Total Bilirubin: 0.8 mg/dL (ref 0.2–1.2)
Total Protein: 6.9 g/dL (ref 6.0–8.3)

## 2014-08-18 LAB — CBC WITH DIFFERENTIAL (CANCER CENTER ONLY)
BASO#: 0 10*3/uL (ref 0.0–0.2)
BASO%: 0.5 % (ref 0.0–2.0)
EOS ABS: 0.6 10*3/uL — AB (ref 0.0–0.5)
EOS%: 14.6 % — ABNORMAL HIGH (ref 0.0–7.0)
HCT: 27.2 % — ABNORMAL LOW (ref 38.7–49.9)
HEMOGLOBIN: 9.1 g/dL — AB (ref 13.0–17.1)
LYMPH#: 0.5 10*3/uL — AB (ref 0.9–3.3)
LYMPH%: 12.7 % — ABNORMAL LOW (ref 14.0–48.0)
MCH: 32.4 pg (ref 28.0–33.4)
MCHC: 33.5 g/dL (ref 32.0–35.9)
MCV: 97 fL (ref 82–98)
MONO#: 0.3 10*3/uL (ref 0.1–0.9)
MONO%: 6.9 % (ref 0.0–13.0)
NEUT#: 2.5 10*3/uL (ref 1.5–6.5)
NEUT%: 65.3 % (ref 40.0–80.0)
Platelets: 76 10*3/uL — ABNORMAL LOW (ref 145–400)
RBC: 2.81 10*6/uL — ABNORMAL LOW (ref 4.20–5.70)
RDW: 15.6 % (ref 11.1–15.7)
WBC: 3.8 10*3/uL — AB (ref 4.0–10.0)

## 2014-08-18 LAB — CHCC SATELLITE - SMEAR

## 2014-08-18 MED ORDER — OXYCODONE HCL 5 MG PO TABS: 5.0000 mg | ORAL_TABLET | Freq: Four times a day (QID) | ORAL | Status: DC | PRN

## 2014-08-18 NOTE — Patient Outreach (Signed)
[facility]) Care Management  08/18/2014  Antonio Fleming [birth year] [id number]   No response from patient after 3 telephone calls and letter outreach.    PLAN: RNCM will refer patient to Lurline Del to close due to inability to contact. RNCM will notify patients primary MD.   Quinn Plowman RN,BSN,CCM [place] Coordinator [phone]

## 2014-08-18 NOTE — Progress Notes (Signed)
Hematology/Oncology Consultation   Name: Antonio Fleming      MRN: [id number]    Location: Room/bed info not found  Date: 08/18/2014 Time:3:44 PM   REFERRING PHYSICIAN: Mackie Pai, PA-C  REASON FOR CONSULT: History of liver cancer and anemia   DIAGNOSIS:  1. Liver carcinoma 2. Anemia 3. Alcoholic cirrhosis with ascites  4. Hepatitis C  HISTORY OF PRESENT ILLNESS: Antonio Fleming is a very pleasant 64 yo male with history of liver cancer. He states that he was diagnosed 1-2 years ago. He has not been treated at this time. He had a paracentesis in May and states that they removed 9 L of fluid. He is having abdominal pain that is constant.  CT on 06/08/14 showed hepatic cirrhosis and a 4.8 cm mass in the caudate lobe of the liver.  He has a history of alcohol and illicit drug abuse. He is no longer using drugs. He does still drink 1-2 beers a day. He has cut back his cigarette smoking by half and is now down to 2 cartons a month.  He was diagnosed with Hep C several years ago and states that this has been treated.  He has a history of bleeding gastric ulcers. He has received blood in the past.  He is a [place] veteran. He was not exposed to agent orange that he knows of.  He has an enlarged nodule on his left clavicle. He first noticed this several month ago.  He has had some episodes where he became dizzy and fell. 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  Some ankle swelling at times. This comes and goes.  He is able to eat and has a good appetite. He is drinking some water. His weight is ok at this time. He has had no problem with infections. No fever, chills, cough, n/v, rash, headaches, SOB, chest pain, palpitations, diarrhea, blood in urine or stool. He has had some constipation.  No recent episodes of bleeding or bruising.  He has 2 living children both of whom are healthy. He had one son die as an infant from SIDS.  His father passed away from esophageal cancer. No other cancer history  in the family that he is aware of.  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   ROS: All other 10 point review of systems is negative.   PAST MEDICAL HISTORY:   Past Medical History  Diagnosis Date  . Cirrhosis   . Renal disorder   . GI bleed   . Anxiety   . Arthritis   . Blood transfusion without reported diagnosis   . Cancer   . Cataract   . Depression   . GERD (gastroesophageal reflux disease)   . Hypertension   . Substance abuse   . Tuberculosis     ALLERGIES: Allergies  Allergen Reactions  . Codeine Other (See Comments)    Sweating  . Penicillins     N/V  . Tylenol [Acetaminophen]     Liver problems      MEDICATIONS:  Current Outpatient Prescriptions on File Prior to Visit  Medication Sig Dispense Refill  . ferrous sulfate 325 (65 FE) MG tablet Take 1 tablet (325 mg total) by mouth daily with breakfast. 30 tablet 2  . furosemide (LASIX) 40 MG tablet Take 1 mg by mouth daily.     . hydrocodone-ibuprofen (VICOPROFEN) 5-200 MG per tablet Take 1 tablet by mouth every 8 (eight) hours as needed for pain.  30 tablet 0  . omeprazole (PRILOSEC) 20 MG capsule Take 20 mg by mouth daily.     Marland Kitchen oxyCODONE (ROXICODONE) 5 MG immediate release tablet Take 1 tablet (5 mg total) by mouth every 6 (six) hours as needed for severe pain. 28 tablet 0  . propranolol (INDERAL) 10 MG tablet Take 1 tablet by mouth 2 (two) times daily.    Marland Kitchen spironolactone (ALDACTONE) 50 MG tablet Take 1 tablet by mouth 2 (two) times daily.     No current facility-administered medications on file prior to visit.     PAST SURGICAL HISTORY Past Surgical History  Procedure Laterality Date  . Abdominal surgery      band around gut at [facility]  . Throat surgery      FAMILY HISTORY: Family History  Problem Relation Age of Onset  . Heart disease Mother   . Cancer Father     SOCIAL HISTORY:  reports that he has been smoking.  He has never used smokeless tobacco. He reports that  he drinks alcohol. He reports that he does not use illicit drugs.  PERFORMANCE STATUS: The patient's performance status is 1 - Symptomatic but completely ambulatory  PHYSICAL EXAM: Most Recent Vital Signs: There were no vitals taken for this visit. BP 136/69 mmHg  Pulse 68  Temp(Src) 97.3 F (36.3 C) (Oral)  Resp 18  Ht 5\' 5"  (1.651 m)  Wt 127 lb (57.607 kg)  BMI 21.13 kg/m2  General Appearance:    Alert, cooperative, no distress, appears stated age  Head:    Normocephalic, without obvious abnormality, atraumatic  Eyes:    PERRL, conjunctiva/corneas clear, EOM's intact, fundi    benign, both eyes       Ears:    Normal TM's and external ear canals, both ears  Nose:   Nares normal, septum midline, mucosa normal, no drainage    or sinus tenderness  Throat:   Lips, mucosa, and tongue normal; teeth and gums normal  Neck:   Supple, symmetrical, trachea midline, no adenopathy;       thyroid:  No enlargement/tenderness/nodules; no carotid   bruit or JVD  Back:     Symmetric, no curvature, ROM normal, no CVA tenderness  Lungs:     Clear to auscultation bilaterally, respirations unlabored  Chest wall:    No tenderness or deformity  Heart:    Regular rate and rhythm, S1 and S2 normal, no murmur, rub   or gallop  Abdomen:     Soft, distended, palpable liver, bowel sounds present in all four quadrants.   Genitalia:    Normal male without lesion, discharge or tenderness  Rectal:    Normal tone, normal prostate, no masses or tenderness;   guaiac negative stool  Extremities:   Extremities normal, atraumatic, no cyanosis or edema  Pulses:   2+ and symmetric all extremities  Skin:   Skin color, texture, turgor normal, no rashes or lesions  Lymph nodes:   Cervical, supraclavicular, and axillary nodes normal  Neurologic:   CNII-XII intact. Normal strength, sensation and reflexes      throughout   LABORATORY DATA:  Results for orders placed or performed in visit on 08/18/14 (from the past 48  hour(s))  CBC with Differential [facility])     Status: Abnormal   Collection Time: 08/18/14  3:06 PM  Result Value Ref Range   WBC 3.8 (L) 4.0 - 10.0 10e3/uL   RBC 2.81 (L) 4.20 - 5.70 10e6/uL   HGB 9.1 (L)  13.0 - 17.1 g/dL   HCT 27.2 (L) 38.7 - 49.9 %   MCV 97 82 - 98 fL   MCH 32.4 28.0 - 33.4 pg   MCHC 33.5 32.0 - 35.9 g/dL   RDW 15.6 11.1 - 15.7 %   Platelets 76 (L) 145 - 400 10e3/uL   NEUT# 2.5 1.5 - 6.5 10e3/uL   LYMPH# 0.5 (L) 0.9 - 3.3 10e3/uL   MONO# 0.3 0.1 - 0.9 10e3/uL   Eosinophils Absolute 0.6 (H) 0.0 - 0.5 10e3/uL   BASO# 0.0 0.0 - 0.2 10e3/uL   NEUT% 65.3 40.0 - 80.0 %   LYMPH% 12.7 (L) 14.0 - 48.0 %   MONO% 6.9 0.0 - 13.0 %   EOS% 14.6 (H) 0.0 - 7.0 %   BASO% 0.5 0.0 - 2.0 %  [facility] - Smear     Status: None   Collection Time: 08/18/14  3:06 PM  Result Value Ref Range   Smear Result Smear Available       RADIOGRAPHY: No results found.     PATHOLOGY: None  ASSESSMENT/PLAN: Mr. Pernell is a very pleasant 64 yo male with history of liver cancer. He states that he was diagnosed 1-2 years ago but has never been treated. He also has alcohol induced cirrhosis and was previously treated for Hep C.  He had 9 L of fluid removed from his abdomen in May. He is symptomatic with abdominal pain. We will refill his oxycodone.  He has a suspicious mass on his left clavicle that we will further assess with a CT scan.  He is anemic with Hgb of 9.1 and MCV 76. We will see what his iron studies show.  We will get CT scans the first week of August and then plan to see him back in the office in 3 weeks for follow-up and develop a treatment plan.  All questions were answered. He knows to contact us with any questions or concerns. We can certainly see him much sooner if necessary.  He was discussed with and also seen by Dr. Marin Olp and he is in agreement with the aforementioned.   [facility]    Addendum:   I saw and examined the patient with Sarah. This is  incredibly complicated. There is no actual tissue diagnosis that I can tell for his liver cancer. I would have to assume that he has hepatocellular carcinoma. If he has hepatitis C, he certainly would be at risk for hepatocellular carcinoma.  We will see what his alpha-fetoprotein level is.  From the CT scan that had done at a local hospital 2 months ago, it just showed that there was a solitary mass in the caudate lobe of the liver. This mass was only 4.8 cm.  I am not sure what this mass is at the left sternoclavicular notch. I would think that it would be highly unlikely for it to be malignant. However, we have to check this out.  I think he will need some CT scans.  It is obvious to me that he is not going to die from hepatocellular carcinoma. It sounds like he has bad cirrhosis. I will like to think though that if he had treatment for the hepatitis C, that this cirrhosis will not progress. His been about 2 months since he's had a paracentesis so maybe this is encouraging.  I think that if we do not find any obvious metastatic disease, then the next step would be to consider him for either RFA or  cryoablation of this hepatic mass. He is not a candidate for surgery for resection given his cirrhosis. I would not think that he is a candidate for liver transplant.  He did serve in [place].  I suppose that there might be some occupational exposure when he was there. This would be hard to prove.  I thoroughly enjoyed talking with he and his wife. He certainly has had a very tough life but he is of a very good attitude.  We will probably get him back to see [place] in about 2-3 weeks. His wife is working and she is the only one who can bring him to his appointments.  We spent a good 45 minutes with him.   Laurey Arrow

## 2014-08-18 NOTE — Patient Instructions (Signed)
Smoking Cessation Quitting smoking is important to your health and has many advantages. However, it is not always easy to quit since nicotine is a very addictive drug. Oftentimes, people try 3 times or more before being able to quit. This document explains the best ways for you to prepare to quit smoking. Quitting takes hard work and a lot of effort, but you can do it. ADVANTAGES OF QUITTING SMOKING  You will live longer, feel better, and live better.  Your body will feel the impact of quitting smoking almost immediately.  Within 20 minutes, blood pressure decreases. Your pulse returns to its normal level.  After 8 hours, carbon monoxide levels in the blood return to normal. Your oxygen level increases.  After 24 hours, the chance of having a heart attack starts to decrease. Your breath, hair, and body stop smelling like smoke.  After 48 hours, damaged nerve endings begin to recover. Your sense of taste and smell improve.  After 72 hours, the body is virtually free of nicotine. Your bronchial tubes relax and breathing becomes easier.  After 2 to 12 weeks, lungs can hold more air. Exercise becomes easier and circulation improves.  The risk of having a heart attack, stroke, cancer, or lung disease is greatly reduced.  After 1 year, the risk of coronary heart disease is cut in half.  After 5 years, the risk of stroke falls to the same as a nonsmoker.  After 10 years, the risk of lung cancer is cut in half and the risk of other cancers decreases significantly.  After 15 years, the risk of coronary heart disease drops, usually to the level of a nonsmoker.  If you are pregnant, quitting smoking will improve your chances of having a healthy baby.  The people you live with, especially any children, will be healthier.  You will have extra money to spend on things other than cigarettes. QUESTIONS TO THINK ABOUT BEFORE ATTEMPTING TO QUIT You may want to talk about your answers with your  health care provider.  Why do you want to quit?  If you tried to quit in the past, what helped and what did not?  What will be the most difficult situations for you after you quit? How will you plan to handle them?  Who can help you through the tough times? Your family? Friends? A health care provider?  What pleasures do you get from smoking? What ways can you still get pleasure if you quit? Here are some questions to ask your health care provider:  How can you help me to be successful at quitting?  What medicine do you think would be best for me and how should I take it?  What should I do if I need more help?  What is smoking withdrawal like? How can I get information on withdrawal? GET READY  Set a quit date.  Change your environment by getting rid of all cigarettes, ashtrays, matches, and lighters in your home, car, or work. Do not let people smoke in your home.  Review your past attempts to quit. Think about what worked and what did not. GET SUPPORT AND ENCOURAGEMENT You have a better chance of being successful if you have help. You can get support in many ways.  Tell your family, friends, and coworkers that you are going to quit and need their support. Ask them not to smoke around you.  Get individual, group, or telephone counseling and support. Programs are available at local hospitals and health centers. Call   your local health department for information about programs in your area.  Spiritual beliefs and practices may help some smokers quit.  Download a "quit meter" on your computer to keep track of quit statistics, such as how long you have gone without smoking, cigarettes not smoked, and money saved.  Get a self-help book about quitting smoking and staying off tobacco. LEARN NEW SKILLS AND BEHAVIORS  Distract yourself from urges to smoke. Talk to someone, go for a walk, or occupy your time with a task.  Change your normal routine. Take a different route to work.  Drink tea instead of coffee. Eat breakfast in a different place.  Reduce your stress. Take a hot bath, exercise, or read a book.  Plan something enjoyable to do every day. Reward yourself for not smoking.  Explore interactive web-based programs that specialize in helping you quit. GET MEDICINE AND USE IT CORRECTLY Medicines can help you stop smoking and decrease the urge to smoke. Combining medicine with the above behavioral methods and support can greatly increase your chances of successfully quitting smoking.  Nicotine replacement therapy helps deliver nicotine to your body without the negative effects and risks of smoking. Nicotine replacement therapy includes nicotine gum, lozenges, inhalers, nasal sprays, and skin patches. Some may be available over-the-counter and others require a prescription.  Antidepressant medicine helps people abstain from smoking, but how this works is unknown. This medicine is available by prescription.  Nicotinic receptor partial agonist medicine simulates the effect of nicotine in your brain. This medicine is available by prescription. Ask your health care provider for advice about which medicines to use and how to use them based on your health history. Your health care provider will tell you what side effects to look out for if you choose to be on a medicine or therapy. Carefully read the information on the package. Do not use any other product containing nicotine while using a nicotine replacement product.  RELAPSE OR DIFFICULT SITUATIONS Most relapses occur within the first 3 months after quitting. Do not be discouraged if you start smoking again. Remember, most people try several times before finally quitting. You may have symptoms of withdrawal because your body is used to nicotine. You may crave cigarettes, be irritable, feel very hungry, cough often, get headaches, or have difficulty concentrating. The withdrawal symptoms are only temporary. They are strongest  when you first quit, but they will go away within 10-14 days. To reduce the chances of relapse, try to:  Avoid drinking alcohol. Drinking lowers your chances of successfully quitting.  Reduce the amount of caffeine you consume. Once you quit smoking, the amount of caffeine in your body increases and can give you symptoms, such as a rapid heartbeat, sweating, and anxiety.  Avoid smokers because they can make you want to smoke.  Do not let weight gain distract you. Many smokers will gain weight when they quit, usually less than 10 pounds. Eat a healthy diet and stay active. You can always lose the weight gained after you quit.  Find ways to improve your mood other than smoking. FOR MORE INFORMATION  www.smokefree.gov  Document Released: 01/08/2001 Document Revised: 05/31/2013 Document Reviewed: 04/25/2011 ExitCare Patient Information 2015 [employer]. This information is not intended to replace advice given to you by your health care provider. Make sure you discuss any questions you have with your health care provider.  

## 2014-08-19 ENCOUNTER — Telehealth: Payer: Self-pay | Admitting: Hematology & Oncology

## 2014-08-19 LAB — IRON AND TIBC CHCC
%SAT: 38 % (ref 20–55)
IRON: 82 ug/dL (ref 42–163)
TIBC: 216 ug/dL (ref 202–409)
UIBC: 135 ug/dL (ref 117–376)

## 2014-08-19 LAB — FERRITIN CHCC: FERRITIN: 214 ng/mL (ref 22–316)

## 2014-08-19 NOTE — Addendum Note (Signed)
Addended by: Trevor Mace on: 08/19/2014 01:35 PM   Modules accepted: Orders, Medications

## 2014-08-19 NOTE — Telephone Encounter (Signed)
Lt mess regarding CT scheduled for 8/2 and instructions.

## 2014-08-23 LAB — HEPATITIS C RNA QUANTITATIVE
HCV Quantitative Log: 6.2 {Log} — ABNORMAL HIGH (ref <1.18)
HCV Quantitative: 1595833 IU/mL — ABNORMAL HIGH (ref <15)

## 2014-08-23 LAB — AFP TUMOR MARKER: AFP-Tumor Marker: 1.7 ng/mL (ref <6.1)

## 2014-08-23 LAB — HEPATITIS PANEL, ACUTE
HCV Ab: REACTIVE — AB
HEP B C IGM: NONREACTIVE
Hep A IgM: NONREACTIVE
Hepatitis B Surface Ag: NEGATIVE

## 2014-08-23 LAB — RETICULOCYTES (CHCC)
ABS Retic: 36.5 10*3/uL (ref 19.0–186.0)
RBC.: 2.81 MIL/uL — AB (ref 4.22–5.81)
Retic Ct Pct: 1.3 % (ref 0.4–2.3)

## 2014-08-23 LAB — PREALBUMIN: Prealbumin: 14 mg/dL — ABNORMAL LOW (ref 21–43)

## 2014-08-23 LAB — HEMOCHROMATOSIS DNA-PCR(C282Y,H63D)

## 2014-08-23 LAB — LACTATE DEHYDROGENASE: LDH: 133 U/L (ref 94–250)

## 2014-08-23 NOTE — Patient Outreach (Signed)
[facility]) Care Management  08/23/2014  Antonio Fleming 1950-11-05 [id number]   Notification from Quinn Plowman, RN to close case due to unable to contact patient for [place] Management services.  Antonio Fleming. [place], [place] Management [place] Assistant Phone: [phone] Fax: [phone]

## 2014-08-26 ENCOUNTER — Other Ambulatory Visit: Payer: Commercial Managed Care - HMO

## 2014-08-30 ENCOUNTER — Ambulatory Visit (HOSPITAL_BASED_OUTPATIENT_CLINIC_OR_DEPARTMENT_OTHER): Payer: Commercial Managed Care - HMO

## 2014-08-30 ENCOUNTER — Encounter (HOSPITAL_BASED_OUTPATIENT_CLINIC_OR_DEPARTMENT_OTHER): Payer: Self-pay

## 2014-08-30 ENCOUNTER — Ambulatory Visit (HOSPITAL_BASED_OUTPATIENT_CLINIC_OR_DEPARTMENT_OTHER)
Admission: RE | Admit: 2014-08-30 | Discharge: 2014-08-30 | Disposition: A | Discharge status: Home / Self Care | Payer: Commercial Managed Care - HMO | Source: Ambulatory Visit | Attending: Family | Admitting: Family

## 2014-08-30 ENCOUNTER — Other Ambulatory Visit: Payer: Commercial Managed Care - HMO

## 2014-08-30 DIAGNOSIS — R222 Localized swelling, mass and lump, trunk: Secondary | ICD-10-CM | POA: Insufficient documentation

## 2014-08-30 DIAGNOSIS — R188 Other ascites: Secondary | ICD-10-CM | POA: Insufficient documentation

## 2014-08-30 DIAGNOSIS — R160 Hepatomegaly, not elsewhere classified: Secondary | ICD-10-CM | POA: Diagnosis not present

## 2014-08-30 DIAGNOSIS — R161 Splenomegaly, not elsewhere classified: Secondary | ICD-10-CM | POA: Diagnosis not present

## 2014-08-30 DIAGNOSIS — K7460 Unspecified cirrhosis of liver: Secondary | ICD-10-CM | POA: Diagnosis not present

## 2014-08-30 DIAGNOSIS — K766 Portal hypertension: Secondary | ICD-10-CM | POA: Diagnosis not present

## 2014-08-30 DIAGNOSIS — C220 Liver cell carcinoma: Secondary | ICD-10-CM

## 2014-08-30 LAB — FECAL OCCULT BLOOD, IMMUNOCHEMICAL: FECAL OCCULT BLD: NEGATIVE

## 2014-08-30 MED ORDER — IOHEXOL 300 MG/ML  SOLN
100.0000 mL | Freq: Once | INTRAMUSCULAR | Status: AC | PRN
  Administered 2014-08-30: 100 mL via INTRAVENOUS

## 2014-08-30 NOTE — Addendum Note (Signed)
Addended by: Modena Morrow D on: 08/30/2014 11:58 AM   Modules accepted: Orders

## 2014-08-30 NOTE — Addendum Note (Signed)
Addended by: Modena Morrow D on: 08/30/2014 11:57 AM   Modules accepted: Orders

## 2014-08-31 ENCOUNTER — Telehealth: Payer: Self-pay | Admitting: Hematology & Oncology

## 2014-08-31 ENCOUNTER — Other Ambulatory Visit: Payer: Self-pay | Admitting: Family

## 2014-08-31 ENCOUNTER — Telehealth: Payer: Self-pay | Admitting: Family

## 2014-08-31 DIAGNOSIS — R160 Hepatomegaly, not elsewhere classified: Secondary | ICD-10-CM

## 2014-08-31 NOTE — Telephone Encounter (Signed)
Called to notify Antonio Fleming that we will be scheduling him for a biopsy of the liver. He was driving in the rain and couldn't hear well so he will call back in the morning.

## 2014-08-31 NOTE — Telephone Encounter (Signed)
LT MESS WITH CENTRAL SCHEDULING TO SCHEDULE US BIOPSY. WILL NEED TO CHECK THE CHART TO SEE IF SCHEDULE

## 2014-09-06 ENCOUNTER — Telehealth: Payer: Self-pay | Admitting: Hematology & Oncology

## 2014-09-06 NOTE — Telephone Encounter (Signed)
[employer] Pre Auth Request Form complete and faxed to:  F: [phone]  The fax listed on the form [phone], does not work in this office.     COPY SCANNED

## 2014-09-06 NOTE — Telephone Encounter (Signed)
Antonio Fleming faxed

## 2014-09-07 ENCOUNTER — Ambulatory Visit (HOSPITAL_COMMUNITY): Admission: RE | Admit: 2014-09-07 | Payer: Commercial Managed Care - HMO | Source: Ambulatory Visit

## 2014-09-09 ENCOUNTER — Ambulatory Visit (HOSPITAL_BASED_OUTPATIENT_CLINIC_OR_DEPARTMENT_OTHER): Payer: Commercial Managed Care - HMO | Admitting: Hematology & Oncology

## 2014-09-09 ENCOUNTER — Encounter: Payer: Self-pay | Admitting: Hematology & Oncology

## 2014-09-09 ENCOUNTER — Other Ambulatory Visit (HOSPITAL_BASED_OUTPATIENT_CLINIC_OR_DEPARTMENT_OTHER): Payer: Commercial Managed Care - HMO

## 2014-09-09 VITALS — BP 111/62 | HR 56 | Temp 97.5°F | Resp 16 | Ht 65.0 in | Wt 131.0 lb

## 2014-09-09 DIAGNOSIS — K717 Toxic liver disease with fibrosis and cirrhosis of liver: Secondary | ICD-10-CM

## 2014-09-09 DIAGNOSIS — C220 Liver cell carcinoma: Secondary | ICD-10-CM

## 2014-09-09 LAB — CBC WITH DIFFERENTIAL (CANCER CENTER ONLY)
BASO#: 0 10*3/uL (ref 0.0–0.2)
BASO%: 0.2 % (ref 0.0–2.0)
EOS ABS: 0.6 10*3/uL — AB (ref 0.0–0.5)
EOS%: 13.7 % — ABNORMAL HIGH (ref 0.0–7.0)
HCT: 25.6 % — ABNORMAL LOW (ref 38.7–49.9)
HEMOGLOBIN: 8.6 g/dL — AB (ref 13.0–17.1)
LYMPH#: 0.6 10*3/uL — ABNORMAL LOW (ref 0.9–3.3)
LYMPH%: 14.4 % (ref 14.0–48.0)
MCH: 32.5 pg (ref 28.0–33.4)
MCHC: 33.6 g/dL (ref 32.0–35.9)
MCV: 97 fL (ref 82–98)
MONO#: 0.3 10*3/uL (ref 0.1–0.9)
MONO%: 6.8 % (ref 0.0–13.0)
NEUT#: 2.8 10*3/uL (ref 1.5–6.5)
NEUT%: 64.9 % (ref 40.0–80.0)
Platelets: 76 10*3/uL — ABNORMAL LOW (ref 145–400)
RBC: 2.65 10*6/uL — ABNORMAL LOW (ref 4.20–5.70)
RDW: 14.9 % (ref 11.1–15.7)
WBC: 4.2 10*3/uL (ref 4.0–10.0)

## 2014-09-09 LAB — CMP (CANCER CENTER ONLY)
ALK PHOS: 74 U/L (ref 26–84)
ALT(SGPT): 20 U/L (ref 10–47)
AST: 33 U/L (ref 11–38)
Albumin: 3.1 g/dL — ABNORMAL LOW (ref 3.3–5.5)
BILIRUBIN TOTAL: 1 mg/dL (ref 0.20–1.60)
BUN: 17 mg/dL (ref 7–22)
CO2: 19 meq/L (ref 18–33)
Calcium: 10.7 mg/dL — ABNORMAL HIGH (ref 8.0–10.3)
Chloride: 109 mEq/L — ABNORMAL HIGH (ref 98–108)
Creat: 1.1 mg/dl (ref 0.6–1.2)
Glucose, Bld: 129 mg/dL — ABNORMAL HIGH (ref 73–118)
Potassium: 5.4 mEq/L — ABNORMAL HIGH (ref 3.3–4.7)
Sodium: 139 mEq/L (ref 128–145)
Total Protein: 7.1 g/dL (ref 6.4–8.1)

## 2014-09-09 MED ORDER — FUROSEMIDE 40 MG PO TABS: 40.0000 mg | ORAL_TABLET | Freq: Every day | ORAL | Status: DC

## 2014-09-09 MED ORDER — OXYCODONE HCL 5 MG PO TABS: ORAL_TABLET | ORAL | Status: DC

## 2014-09-09 NOTE — Progress Notes (Signed)
Hematology and Oncology Follow Up Visit  Sho Salguero [id number] 04/02/1950 64 y.o. 09/09/2014   Principle Diagnosis:   Hepatocellular carcinoma  Hepatitis C with cirrhosis/ascites  Current Therapy:    Observation     Interim History:  Mr. Bracco is back for follow-up. We saw him initially back in July. At that point time, he had a liver mass. He has hepatitis C and has never been treated. He has cirrhosis. He had ascites.  We did a alpha-fetoprotein on him. Surprisingly enough, this was normal at 1.7.  I was called by one of the radiologist. I had wanted Mr. Lavine to get a biopsy. However, the radiologist said that this was hepatocellular carcinoma despite the normal out of fetoprotein and that they would not recommend a biopsy.  His hepatitis C is clearly never been treated. His hepatitis C titer was [id number].  He did have a follow-up CT scan done. The CT scan showed a mass in the caudate lobe measuring 5 x 4.5 cm. He had cirrhotic changes. No other masses were noted. Of note, he did have a mass overlying the left clavicular head with abnormal vasculature.  He sees be doing about the same. He is eating okay. He's had no bleeding. He's had no nausea or vomiting.  Overall, his performance status is ECOG 3 at best.  Medications:  Current outpatient prescriptions:  .  folic acid (FOLVITE) 1 MG tablet, Take 1 mg by mouth daily., Disp: , Rfl:  .  oxyCODONE (ROXICODONE) 5 MG immediate release tablet, Take 1-2 pills, if needed, every 6 hours for pain secondary to liver cancer., Disp: 120 tablet, Rfl: 0 .  propranolol (INDERAL) 10 MG tablet, Take 1 tablet by mouth 2 (two) times daily., Disp: , Rfl:  .  spironolactone (ALDACTONE) 50 MG tablet, Take 1 tablet by mouth 2 (two) times daily., Disp: , Rfl:  .  furosemide (LASIX) 40 MG tablet, Take 1 tablet (40 mg total) by mouth daily., Disp: 30 tablet, Rfl: 12 .  lactulose (CEPHULAC) 10 G packet, Take 10 g by mouth as needed., Disp: , Rfl:    Allergies:  Allergies  Allergen Reactions  . Penicillins     N/V N/V  . Tylenol [Acetaminophen]     Liver problems  . Codeine Other (See Comments) and Rash    Sweating    Past Medical History, Surgical history, Social history, and Family History were reviewed and updated.  Review of Systems: As above  Physical Exam:  height is 5\' 5"  (1.651 m) and weight is 131 lb (59.421 kg). His oral temperature is 97.5 F (36.4 C). His blood pressure is 111/62 and his pulse is 56. His respiration is 16.   Wt Readings from Last 3 Encounters:  09/09/14 131 lb (59.421 kg)  08/18/14 127 lb (57.607 kg)  08/10/14 125 lb 12.8 oz (57.063 kg)     Thin, chronically ill-appearing white gentleman. Head and neck exam shows no scleral icterus. He has no ocular or oral lesions. He has no adenopathy in the neck. Lungs are clear. Maybe slight decrease at the bases. Cardiac exam regular rate and rhythm. He has an occasional beat. Chest wall exam does show this mass associated with the left clavicular head. This probably measures about 4 x 5 cm. It is slightly fleshy. It is nontender. Abdomen is soft. There is a fluid wave. He has no obvious guarding or rebound tenderness. He has no abdominal mass. There is no palpable splenomegaly. I cannot palpate his liver. Extremities  shows some 1+ edema in his legs. He has some age related osteoarthritic changes in his joints. Skin exam shows scattered ecchymoses. Neurological exam shows no focal neurological deficits.  Lab Results  Component Value Date   WBC 4.2 09/09/2014   HGB 8.6* 09/09/2014   HCT 25.6* 09/09/2014   MCV 97 09/09/2014   PLT 76* 09/09/2014     Chemistry      Component Value Date/Time   NA 139 09/09/2014 1318   NA 141 08/18/2014 1507   K 5.4* 09/09/2014 1318   K 4.6 08/18/2014 1507   CL 109* 09/09/2014 1318   CL 115* 08/18/2014 1507   CO2 19 09/09/2014 1318   CO2 19 08/18/2014 1507   BUN 17 09/09/2014 1318   BUN 26* 08/18/2014 1507    CREATININE 1.1 09/09/2014 1318   CREATININE 1.20 08/18/2014 1507      Component Value Date/Time   CALCIUM 10.7* 09/09/2014 1318   CALCIUM 10.7* 08/18/2014 1507   ALKPHOS 74 09/09/2014 1318   ALKPHOS 61 08/18/2014 1507   AST 33 09/09/2014 1318   AST 21 08/18/2014 1507   ALT 20 09/09/2014 1318   ALT 14 08/18/2014 1507   BILITOT 1.00 09/09/2014 1318   BILITOT 0.8 08/18/2014 1507         Impression and Plan: Mr. Bloom is 64 year old gentleman. He has hepatitis C. This is not been treated. This definitely would help him to have this treated. I will have to get him back to his family doctor to see if they can arrange for some type of hepatitis follow-up.  Ideally, this isolated hepatic mass would be resected. However, Mr. Mckinnon is in no condition to have any surgical intervention. His Childs-Pugh classification is probably C and he is not a surgical candidate.  I am not sure what this chest wall masses up by the left clavicular head. I would think that would be highly unlikely that this would be a metastatic focus.  He needs a paracentesis.  He does not want anything done until after Labor Day.  We will also make a referral to interventional radiology to see if some type of intrahepatic therapy can be done.  Mr. Ostermiller seems to be holding his own.  I will see him back in about 6 weeks and we will see how much we have accomplished.    Volanda Napoleon, MD 8/12/20162:34 PM

## 2014-09-12 ENCOUNTER — Ambulatory Visit (HOSPITAL_COMMUNITY)
Admission: RE | Admit: 2014-09-12 | Discharge: 2014-09-12 | Disposition: A | Discharge status: Home / Self Care | Payer: Commercial Managed Care - HMO | Source: Ambulatory Visit | Attending: Hematology & Oncology | Admitting: Hematology & Oncology

## 2014-09-12 DIAGNOSIS — C220 Liver cell carcinoma: Secondary | ICD-10-CM

## 2014-09-12 DIAGNOSIS — K717 Toxic liver disease with fibrosis and cirrhosis of liver: Secondary | ICD-10-CM | POA: Diagnosis not present

## 2014-09-12 DIAGNOSIS — R188 Other ascites: Secondary | ICD-10-CM | POA: Insufficient documentation

## 2014-09-12 LAB — RETICULOCYTES (CHCC)
ABS Retic: 34.1 10*3/uL (ref 19.0–186.0)
RBC.: 2.62 MIL/uL — AB (ref 4.22–5.81)
RETIC CT PCT: 1.3 % (ref 0.4–2.3)

## 2014-09-12 LAB — IRON AND TIBC CHCC
%SAT: 46 % (ref 20–55)
Iron: 97 ug/dL (ref 42–163)
TIBC: 210 ug/dL (ref 202–409)
UIBC: 113 ug/dL — ABNORMAL LOW (ref 117–376)

## 2014-09-12 LAB — HEMOCHROMATOSIS DNA-PCR(C282Y,H63D)

## 2014-09-12 LAB — ERYTHROPOIETIN: Erythropoietin: 20.9 m[IU]/mL — ABNORMAL HIGH (ref 2.6–18.5)

## 2014-09-12 LAB — FERRITIN CHCC: Ferritin: 191 ng/ml (ref 22–316)

## 2014-09-12 NOTE — Procedures (Signed)
Successful US guided paracentesis from RLQ.  Yielded 1.4 liters of serous fluid.  No immediate complications.  Pt tolerated well.   Specimen was not sent for labs.  Tsosie Billing D PA-C 09/12/2014 4:02 PM

## 2014-09-14 ENCOUNTER — Telehealth: Payer: Self-pay | Admitting: Hematology & Oncology

## 2014-09-14 NOTE — Telephone Encounter (Signed)
Lt mess for Antonio Fleming at IR to call back regarding scheduling appt for intrahepatic therapy. Following up from Friday converastion

## 2014-09-19 ENCOUNTER — Telehealth: Payer: Self-pay | Admitting: Hematology & Oncology

## 2014-09-19 NOTE — Telephone Encounter (Signed)
Faxed over referral to [facility] regarding scheduling intrahepatic therapy

## 2014-09-29 ENCOUNTER — Other Ambulatory Visit: Payer: Commercial Managed Care - HMO

## 2014-10-05 ENCOUNTER — Ambulatory Visit
Admission: RE | Admit: 2014-10-05 | Discharge: 2014-10-05 | Disposition: A | Discharge status: Home / Self Care | Payer: Commercial Managed Care - HMO | Source: Ambulatory Visit | Attending: Hematology & Oncology | Admitting: Hematology & Oncology

## 2014-10-05 DIAGNOSIS — K717 Toxic liver disease with fibrosis and cirrhosis of liver: Secondary | ICD-10-CM

## 2014-10-05 DIAGNOSIS — C220 Liver cell carcinoma: Secondary | ICD-10-CM

## 2014-10-05 NOTE — Consult Note (Addendum)
Chief Complaint: Patient was seen in consultation today for  Chief Complaint  Patient presents with  . Advice Only    [place]   at the request of Ennever,Peter R  Referring Physician(s): Ennever,Peter R  History of Present Illness: Stefan Markarian is a 64 y.o. 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 He has not gone prior antiretroviral therapy for his hepatitis C. He has had several paracenteses for his ascites. Mr. Sherlene Shams alpha-fetoprotein is normal at 1.7. He falls into the approximately 30% of hepatocellular carcinomas which are non-secretor's of AFP.  Upon review of his chart, I see that this mass was previously seen and read as diagnostic hepatocellular carcinoma on an abdominal MRI performed in March 2015. The lesion was approximately 3 cm at that time .   Currently, Mr. keen complains of chronic generalized pain for which he takes narcotic pain medicines. He notes that his pain prescription has run out. He denies right upper quadrant pain, nausea, vomiting or changes in his appetite or general function. He feels mildly weak all the time but is able to ambulate with a cane. He estimates he sleeps approximately 8 or 9 hours a night and rarely takes a nap during the day. He cooks, does chores around the house, cleans, washes and dresses himself and occasionally mows the grass with his riding lawnmower. Overall, he is happy with his quality of life at the present time.  He is interested in seeking treatment options.  Past Medical History  Diagnosis Date  . Cirrhosis   . Renal disorder   . GI bleed   . Anxiety   . Arthritis   . Blood transfusion without reported diagnosis   . Cancer   . Cataract   . Depression   . GERD (gastroesophageal reflux disease)   . Hypertension   . Substance abuse   . Tuberculosis     Past Surgical History    Procedure Laterality Date  . Abdominal surgery      band around gut at [facility]  . Throat surgery    . Eye surgery  2013      Bilateral cataract surgery      Allergies: Penicillins; Tylenol; and Codeine  Medications: Prior to Admission medications   Medication Sig Start Date End Date Taking? Authorizing Provider  folic acid (FOLVITE) 1 MG tablet Take 1 mg by mouth daily.   Yes Historical Provider, MD  furosemide (LASIX) 40 MG tablet Take 1 tablet (40 mg total) by mouth daily. 09/09/14  Yes Volanda Napoleon, MD  oxyCODONE (ROXICODONE) 5 MG immediate release tablet Take 1-2 pills, if needed, every 6 hours for pain secondary to liver cancer. 09/09/14  Yes Volanda Napoleon, MD  propranolol (INDERAL) 10 MG tablet Take 1 tablet by mouth 2 (two) times daily. 12/17/12  Yes Historical Provider, MD  spironolactone (ALDACTONE) 50 MG tablet Take 1 tablet by mouth 2 (two) times daily. 12/16/12  Yes Historical Provider, MD  lactulose (CEPHULAC) 10 G packet Take 10 g by mouth as needed.    Historical Provider, MD     Family History  Problem Relation Age of Onset  . Heart disease Mother   . Cancer Father     Social History   Social History  . Marital Status: Divorced    Spouse Name: N/A  . Number of Children: N/A  . Years of  Education: N/A   Social History Main Topics  . Smoking status: Current Every Day Smoker -- 1.00 packs/day for 49 years    Types: Cigarettes    Start date: 03/20/1973  . Smokeless tobacco: Never Used     Comment: 08-18-14  still smoking  . Alcohol Use: 0.0 oz/week    0 Standard drinks or equivalent per week  . Drug Use: No  . Sexual Activity: Not Asked   Other Topics Concern  . None   Social History Narrative    ECOG Status: 2 - Symptomatic, <50% confined to bed  Review of Systems: A 12 point ROS discussed and pertinent positives are indicated in the HPI above.  All other systems are negative.  Review of Systems  Vital Signs: BP 83/56 mmHg  Pulse 69   Temp(Src) 98.2 F (36.8 C) (Oral)  Resp 14  Ht 5\' 5"  (1.651 m)  Wt 130 lb (58.968 kg)  BMI 21.63 kg/m2  SpO2 99%  Physical Exam  Constitutional: He is oriented to person, place, and time. He appears well-developed and well-nourished. He appears cachectic. No distress.  HENT:  Head: Normocephalic and atraumatic.  Eyes: No scleral icterus.  Cardiovascular: Normal rate and regular rhythm.   Pulmonary/Chest: Effort normal and breath sounds normal.  Abdominal: Soft. He exhibits no distension. There is no tenderness.  Neurological: He is alert and oriented to person, place, and time.  Skin: Skin is warm and dry.  Psychiatric: He has a normal mood and affect. His behavior is normal.  Nursing note and vitals reviewed.   Mallampati Score:   2  Imaging: US Paracentesis  09/12/2014   INDICATION: Cirrhosis, hepatocellular carcinoma, ascites and request for paracentesis.  EXAM: ULTRASOUND-GUIDED PARACENTESIS  COMPARISON:  CT Abdomen/Pelvis 08/31/14.  MEDICATIONS: None.  COMPLICATIONS: None immediate  TECHNIQUE: Informed written consent was obtained from the patient after a discussion of the risks, benefits and alternatives to treatment. A timeout was performed prior to the initiation of the procedure.  Initial ultrasound scanning demonstrates a small amount of ascites within the right lower abdominal quadrant. The right lower abdomen was prepped and draped in the usual sterile fashion. 1% lidocaine was used for local anesthesia.  Under direct ultrasound guidance, a 19 gauge, 7-cm, Yueh catheter was introduced. An ultrasound image was saved for documentation purposed. The paracentesis was performed. The catheter was removed and a dressing was applied. The patient tolerated the procedure well without immediate post procedural complication.  FINDINGS: A total of approximately 1.4 liters of serous fluid was removed. Samples were sent to the laboratory as requested by the clinical team.  IMPRESSION:  Successful ultrasound-guided paracentesis yielding 1.4 liters of peritoneal fluid.  Read By:  Tsosie Billing PA-C   Electronically Signed   By: Jacqulynn Cadet M.D.   On: 09/12/2014 16:04    Labs:  CBC:  Recent Labs  08/03/14 1700 08/10/14 1243 08/18/14 1506 09/09/14 1317  WBC 4.2 3.6* 3.8* 4.2  HGB 9.3* 8.7 Repeated and verified X2.* 9.1* 8.6*  HCT 27.6* 25.7 Repeated and verified X2.* 27.2* 25.6*  PLT 81.0* 83.0* 76* 76*    COAGS: No results for input(s): INR, APTT in the last 8760 hours.  BMP:  Recent Labs  08/03/14 1700 08/18/14 1507 09/09/14 1318  NA 141 141 139  K 4.8 4.6 5.4*  CL 114* 115* 109*  CO2 22 19 19   GLUCOSE 97 123* 129*  BUN 22 26* 17  CALCIUM 11.3* 10.7* 10.7*  CREATININE 1.09 1.20 1.1  LIVER FUNCTION TESTS:  Recent Labs  08/03/14 1700 08/18/14 1507 09/09/14 1318  BILITOT 0.9 0.8 1.00  AST 20 21 33  ALT 14 14 20   ALKPHOS 80 61 74  PROT 7.7 6.9 7.1  ALBUMIN 3.5 3.1*  --     TUMOR MARKERS:  Recent Labs  08/18/14 1506  AFPTM 1.7    Assessment and Plan:  [name] is an unfortunate 64 year old gentleman with HCV cirrhosis complicated by an enlarging hepatocellular carcinoma. This lesion was first diagnosed by imaging via MRI in March 2015. Unfortunately, it appears the patient was lost to follow-up for nearly a year and a half before he re-presented. Imaging now shows a 5 x 4.5 cm mass.  [name] is in overall modest health. He is able to perform all of his activities of daily living and cooks, dresses and cares for himself. His fianc works the night shift and he is often home alone during the day while she sleeps. He is able to do chores around the house including mowing his grass. He estimates he sleeps 8 hours per night and will rarely take a nap during the daytime. Given all of this, his ECOG status is likely a 2. He is very thin, walks with a cane and has issues with chronic pain.  We discussed at length the natural history  of hepatocellular carcinoma and the limited treatment options. Given his overall health status, he is not a candidate for transplantation or surgical resection. With his performance status at a 2, he is a marginal candidate for liver directed therapy including transarterial chemoembolization. We discussed that palliative oral chemotherapy.    After discussion of the risks, benefits and alternatives, he is interested in pursuing liver directed therapy with transarterial chemoembolization. Chemoembolization with drug-eluting beads (DEB-TACE) has been shown to be gentler on the individual compared to conventional TACE. His background liver reserve is good based on his laboratory analysis. I think he would tolerate this therapy. Depending on how he does, he may be a candidate for additional rounds of embolization in the future. He understands that this is a palliative therapy and not a curative therapy.  His hepatitis C has not been treated in the past. Currently this is less of an issue than his hepatocellular carcinoma. However, I would like to refer him to [facility] [place] [facility] for an evaluation. If he does well through initial liver directed therapy he may become a candidate for  1.) Please schedule for chemoembolization with drug-eluting beads to be performed at [facility] as soon as possible. Mr. Delbuono is anxious to begin treatment.  2.) I will have my office staff arrange a referral to [facility] [place]  Thank you for this interesting consult.  I greatly enjoyed meeting Colan Laymon and look forward to participating in their care.  A copy of this report was sent to the requesting provider on this date.  SignedJacqulynn Cadet 10/05/2014, 4:09 PM   I spent a total of  40 Minutes  in face to face in clinical consultation, greater than 50% of which was counseling/coordinating care for [facility].

## 2014-10-10 ENCOUNTER — Telehealth: Payer: Self-pay | Admitting: *Deleted

## 2014-10-10 ENCOUNTER — Other Ambulatory Visit: Payer: Self-pay | Admitting: *Deleted

## 2014-10-10 ENCOUNTER — Other Ambulatory Visit: Payer: Self-pay | Admitting: Interventional Radiology

## 2014-10-10 DIAGNOSIS — K717 Toxic liver disease with fibrosis and cirrhosis of liver: Secondary | ICD-10-CM

## 2014-10-10 DIAGNOSIS — C220 Liver cell carcinoma: Secondary | ICD-10-CM

## 2014-10-10 MED ORDER — FENTANYL 25 MCG/HR TD PT72: 25.0000 ug | MEDICATED_PATCH | TRANSDERMAL | Status: DC

## 2014-10-10 MED ORDER — OXYCODONE HCL 5 MG PO TABS: ORAL_TABLET | ORAL | Status: DC

## 2014-10-10 NOTE — Telephone Encounter (Signed)
Patient calling for prescription refill for his pain medicine. He's asking for the dosing to be increased because the current dosing is not controlling his pain. He still has abdominal pain. Spoke to Dr Marin Olp who wants patient to continue his prn at current dosing, but add a long acting medication. Spoke to patient about this, and educated him on fentanyl patch administration. He will pick up prescriptions tomorrow.

## 2014-10-11 MED ORDER — DOXORUBICIN HCL 50 MG IV SOLR: 150.0000 mg | Freq: Once | INTRAVENOUS | Status: DC

## 2014-10-25 MED ORDER — DOXORUBICIN HCL 50 MG IV SOLR
150.0000 mg | Freq: Once | INTRAVENOUS | Status: AC
  Administered 2014-10-27: 150 mg via INTRA_ARTERIAL
  Filled 2014-10-25: qty 150

## 2014-10-26 ENCOUNTER — Other Ambulatory Visit: Payer: Self-pay | Admitting: Radiology

## 2014-10-27 ENCOUNTER — Ambulatory Visit (HOSPITAL_COMMUNITY)
Admission: RE | Admit: 2014-10-27 | Discharge: 2014-10-27 | Disposition: A | Discharge status: Home / Self Care | Payer: Commercial Managed Care - HMO | Source: Ambulatory Visit | Attending: Interventional Radiology | Admitting: Interventional Radiology

## 2014-10-27 ENCOUNTER — Other Ambulatory Visit: Payer: Self-pay | Admitting: Interventional Radiology

## 2014-10-27 ENCOUNTER — Encounter (HOSPITAL_COMMUNITY): Payer: Self-pay

## 2014-10-27 ENCOUNTER — Observation Stay (HOSPITAL_COMMUNITY)
Admission: RE | Admit: 2014-10-27 | Discharge: 2014-10-28 | Disposition: A | Discharge status: Home / Self Care | Payer: Commercial Managed Care - HMO | Source: Ambulatory Visit | Attending: Interventional Radiology | Admitting: Interventional Radiology

## 2014-10-27 DIAGNOSIS — B1920 Unspecified viral hepatitis C without hepatic coma: Secondary | ICD-10-CM | POA: Insufficient documentation

## 2014-10-27 DIAGNOSIS — C220 Liver cell carcinoma: Secondary | ICD-10-CM

## 2014-10-27 DIAGNOSIS — K7030 Alcoholic cirrhosis of liver without ascites: Secondary | ICD-10-CM | POA: Insufficient documentation

## 2014-10-27 LAB — CBC WITH DIFFERENTIAL/PLATELET
Basophils Absolute: 0 10*3/uL (ref 0.0–0.1)
Basophils Relative: 0 %
EOS ABS: 0.6 10*3/uL (ref 0.0–0.7)
Eosinophils Relative: 12 %
HCT: 30.2 % — ABNORMAL LOW (ref 39.0–52.0)
HEMOGLOBIN: 10.2 g/dL — AB (ref 13.0–17.0)
LYMPHS ABS: 0.6 10*3/uL — AB (ref 0.7–4.0)
Lymphocytes Relative: 12 %
MCH: 31.9 pg (ref 26.0–34.0)
MCHC: 33.8 g/dL (ref 30.0–36.0)
MCV: 94.4 fL (ref 78.0–100.0)
MONOS PCT: 7 %
Monocytes Absolute: 0.4 10*3/uL (ref 0.1–1.0)
NEUTROS PCT: 68 %
Neutro Abs: 3.5 10*3/uL (ref 1.7–7.7)
Platelets: 86 10*3/uL — ABNORMAL LOW (ref 150–400)
RBC: 3.2 MIL/uL — ABNORMAL LOW (ref 4.22–5.81)
RDW: 14.4 % (ref 11.5–15.5)
WBC: 5.1 10*3/uL (ref 4.0–10.5)

## 2014-10-27 LAB — COMPREHENSIVE METABOLIC PANEL
ALK PHOS: 69 U/L (ref 38–126)
ALT: 21 U/L (ref 17–63)
ANION GAP: 9 (ref 5–15)
AST: 35 U/L (ref 15–41)
Albumin: 3.9 g/dL (ref 3.5–5.0)
BILIRUBIN TOTAL: 1.2 mg/dL (ref 0.3–1.2)
BUN: 37 mg/dL — ABNORMAL HIGH (ref 6–20)
CALCIUM: 10.8 mg/dL — AB (ref 8.9–10.3)
CO2: 25 mmol/L (ref 22–32)
Chloride: 105 mmol/L (ref 101–111)
Creatinine, Ser: 1.68 mg/dL — ABNORMAL HIGH (ref 0.61–1.24)
GFR calc non Af Amer: 41 mL/min — ABNORMAL LOW (ref >60)
GFR, EST AFRICAN AMERICAN: 47 mL/min — AB (ref >60)
Glucose, Bld: 115 mg/dL — ABNORMAL HIGH (ref 65–99)
Potassium: 3.9 mmol/L (ref 3.5–5.1)
SODIUM: 139 mmol/L (ref 135–145)
TOTAL PROTEIN: 8.4 g/dL — AB (ref 6.5–8.1)

## 2014-10-27 LAB — APTT: aPTT: 34 seconds (ref 24–37)

## 2014-10-27 LAB — PROTIME-INR
INR: 1.16 (ref 0.00–1.49)
PROTHROMBIN TIME: 15 s (ref 11.6–15.2)

## 2014-10-27 MED ORDER — LIDOCAINE HCL 1 % IJ SOLN
INTRAMUSCULAR | Status: AC
  Filled 2014-10-27: qty 20

## 2014-10-27 MED ORDER — IOHEXOL 300 MG/ML  SOLN
350.0000 mL | Freq: Once | INTRAMUSCULAR | Status: DC | PRN
  Administered 2014-10-27: 116 mL via INTRAVENOUS
  Filled 2014-10-27: qty 350

## 2014-10-27 MED ORDER — FENTANYL CITRATE (PF) 100 MCG/2ML IJ SOLN
INTRAMUSCULAR | Status: AC | PRN
  Administered 2014-10-27: 25 ug via INTRAVENOUS

## 2014-10-27 MED ORDER — MIDAZOLAM HCL 2 MG/2ML IJ SOLN
INTRAMUSCULAR | Status: AC
  Filled 2014-10-27: qty 2

## 2014-10-27 MED ORDER — SODIUM CHLORIDE 0.9 % IV SOLN: 250.0000 mL | INTRAVENOUS | Status: DC | PRN

## 2014-10-27 MED ORDER — DEXAMETHASONE SODIUM PHOSPHATE 10 MG/ML IJ SOLN
10.0000 mg | Freq: Once | INTRAMUSCULAR | Status: AC
  Administered 2014-10-27: 10 mg via INTRAVENOUS
  Filled 2014-10-27: qty 1

## 2014-10-27 MED ORDER — SODIUM CHLORIDE 0.9 % IV SOLN
INTRAVENOUS | Status: AC
  Administered 2014-10-27: 15:00:00 via INTRAVENOUS

## 2014-10-27 MED ORDER — MIDAZOLAM HCL 2 MG/2ML IJ SOLN
INTRAMUSCULAR | Status: AC | PRN
  Administered 2014-10-27: 0.5 mg via INTRAVENOUS

## 2014-10-27 MED ORDER — CIPROFLOXACIN IN D5W 400 MG/200ML IV SOLN
400.0000 mg | Freq: Once | INTRAVENOUS | Status: AC
  Administered 2014-10-27: 400 mg via INTRAVENOUS

## 2014-10-27 MED ORDER — CIPROFLOXACIN IN D5W 400 MG/200ML IV SOLN
INTRAVENOUS | Status: AC
  Filled 2014-10-27: qty 200

## 2014-10-27 MED ORDER — SODIUM CHLORIDE 0.9 % IV SOLN
Freq: Once | INTRAVENOUS | Status: AC
  Administered 2014-10-27: 10:00:00 via INTRAVENOUS

## 2014-10-27 MED ORDER — METRONIDAZOLE IN NACL 5-0.79 MG/ML-% IV SOLN
500.0000 mg | Freq: Once | INTRAVENOUS | Status: AC
  Administered 2014-10-27: 500 mg via INTRAVENOUS
  Filled 2014-10-27: qty 100

## 2014-10-27 MED ORDER — SODIUM CHLORIDE 0.9 % IJ SOLN
3.0000 mL | Freq: Two times a day (BID) | INTRAMUSCULAR | Status: DC
  Administered 2014-10-27: 3 mL via INTRAVENOUS

## 2014-10-27 MED ORDER — DOCUSATE SODIUM 100 MG PO CAPS
100.0000 mg | ORAL_CAPSULE | Freq: Two times a day (BID) | ORAL | Status: DC
  Administered 2014-10-27: 100 mg via ORAL
  Filled 2014-10-27 (×3): qty 1

## 2014-10-27 MED ORDER — SODIUM CHLORIDE 0.9 % IJ SOLN: 3.0000 mL | INTRAMUSCULAR | Status: DC | PRN

## 2014-10-27 MED ORDER — PROMETHAZINE HCL 25 MG PO TABS: 25.0000 mg | ORAL_TABLET | Freq: Three times a day (TID) | ORAL | Status: DC | PRN

## 2014-10-27 MED ORDER — ONDANSETRON HCL 4 MG/2ML IJ SOLN: 4.0000 mg | Freq: Four times a day (QID) | INTRAMUSCULAR | Status: DC | PRN

## 2014-10-27 MED ORDER — OXYCODONE HCL 5 MG PO TABS
10.0000 mg | ORAL_TABLET | ORAL | Status: DC | PRN
  Administered 2014-10-27 – 2014-10-28 (×4): 10 mg via ORAL
  Filled 2014-10-27 (×4): qty 2

## 2014-10-27 MED ORDER — ATROPINE SULFATE 0.1 MG/ML IJ SOLN
INTRAMUSCULAR | Status: AC
  Filled 2014-10-27: qty 10

## 2014-10-27 MED ORDER — PROMETHAZINE HCL 25 MG RE SUPP: 25.0000 mg | Freq: Three times a day (TID) | RECTAL | Status: DC | PRN

## 2014-10-27 MED ORDER — ONDANSETRON HCL 4 MG/2ML IJ SOLN
4.0000 mg | Freq: Once | INTRAMUSCULAR | Status: AC
  Administered 2014-10-27: 4 mg via INTRAVENOUS
  Filled 2014-10-27: qty 2

## 2014-10-27 MED ORDER — FENTANYL CITRATE (PF) 100 MCG/2ML IJ SOLN
INTRAMUSCULAR | Status: AC
  Filled 2014-10-27: qty 2

## 2014-10-27 NOTE — H&P (Signed)
HPI: Patient with HCV Cirrhosis and an enlarging caudate lobe [place] first diagnosed by MRI 03/2013, not a surgical candidate. He has been seen in full consult on 10/05/2014 and scheduled today for DEB-TACE of [place].  The patient has had a H&P performed within the last 30 days, all history, medications, and exam have been reviewed. The patient denies any interval changes since the H&P.  Medications: Prior to Admission medications   Medication Sig Start Date End Date Taking? Authorizing Provider  fentaNYL (DURAGESIC - DOSED MCG/HR) 25 MCG/HR patch Place 1 patch (25 mcg total) onto the skin every 3 (three) days. 10/10/14   Volanda Napoleon, MD  fludrocortisone (FLORINEF) 0.1 MG tablet Take 0.1 mg by mouth daily.    Historical Provider, MD  folic acid (FOLVITE) 1 MG tablet Take 1 mg by mouth daily.    Historical Provider, MD  furosemide (LASIX) 40 MG tablet Take 1 tablet (40 mg total) by mouth daily. 09/09/14   Volanda Napoleon, MD  lactulose (CEPHULAC) 10 G packet Take 10 g by mouth as needed (constipation).     Historical Provider, MD  oxyCODONE (ROXICODONE) 5 MG immediate release tablet Take 1-2 pills, if needed, every 6 hours for pain secondary to liver cancer. 10/10/14   Volanda Napoleon, MD  pantoprazole (PROTONIX) 40 MG tablet Take 40 mg by mouth daily.    Historical Provider, MD  propranolol (INDERAL) 10 MG tablet Take 1 tablet by mouth 2 (two) times daily. 12/17/12   Historical Provider, MD  spironolactone (ALDACTONE) 50 MG tablet Take 1 tablet by mouth daily.  12/16/12   Historical Provider, MD     Vital Signs: BP 111/64 mmHg  Pulse 61  Temp(Src) 97.4 F (36.3 C)  Resp 16  Wt 114 lb 4 oz (51.823 kg)  SpO2 100%  Physical Exam  Constitutional: He is oriented to person, place, and time. No distress.  HENT:  Head: Normocephalic and atraumatic.  Cardiovascular: Normal rate and regular rhythm.  Exam reveals no gallop and no friction rub.   No murmur heard. Pulmonary/Chest: Effort normal and  breath sounds normal. No respiratory distress. He has no wheezes. He has no rales.  Abdominal: Soft. Bowel sounds are normal. There is tenderness.  Musculoskeletal: He exhibits no edema.  Neurological: He is alert and oriented to person, place, and time.  Skin: Skin is warm and dry. He is not diaphoretic.    Mallampati Score:  MD Evaluation Airway: WNL Heart: WNL Abdomen: WNL Chest/ Lungs: WNL ASA  Classification: 3 Mallampati/Airway Score: Two  Labs:  CBC:  Recent Labs  08/10/14 1243 08/18/14 1506 09/09/14 1317 10/27/14 0950  WBC 3.6* 3.8* 4.2 5.1  HGB 8.7 Repeated and verified X2.* 9.1* 8.6* 10.2*  HCT 25.7 Repeated and verified X2.* 27.2* 25.6* 30.2*  PLT 83.0* 76* 76* PENDING    COAGS:  Recent Labs  10/27/14 0950  INR 1.16  APTT 34    BMP:  Recent Labs  08/03/14 1700 08/18/14 1507 09/09/14 1318  NA 141 141 139  K 4.8 4.6 5.4*  CL 114* 115* 109*  CO2 22 19 19   GLUCOSE 97 123* 129*  BUN 22 26* 17  CALCIUM 11.3* 10.7* 10.7*  CREATININE 1.09 1.20 1.1    LIVER FUNCTION TESTS:  Recent Labs  08/03/14 1700 08/18/14 1507 09/09/14 1318  BILITOT 0.9 0.8 1.00  AST 20 21 33  ALT 14 14 20   ALKPHOS 80 61 74  PROT 7.7 6.9 7.1  ALBUMIN 3.5 3.1*  --  Assessment/Plan:  HCV Cirrhosis  Enlarging caudate lobe HCC, not a surgical candidate  Seen in full consult on 10/05/2014 Scheduled today for DEB-TACE of [place] with sedation The patient has been NPO, no blood thinners taken, labs and vitals have been reviewed. Risks and Benefits discussed with the patient including, but not limited to bleeding, infection, vascular injury, contrast induced renal failure, post procedural pain, nausea, vomiting, fatigue, progression of liver failure, chemical cholecystitis, or need for additional procedures. All of the patient's questions were answered, patient is agreeable to proceed. Consent signed and in chart. Patient will be admitted overnight for observation and pain  control.     SignedHedy Jacob 10/27/2014, 10:41 AM

## 2014-10-27 NOTE — Sedation Documentation (Signed)
11fr sheath removed from right fem artery by Dr. Laurence Ferrari. Hemostasis achieved using exoseal closure device and manual pressure held by Dr. Laurence Ferrari and Alfred Levins, RTR for 5 minutes. RDP +3, RPT+2, groin level 0.

## 2014-10-27 NOTE — Progress Notes (Signed)
Received pt to room 1507, VSS, pt on bed rest, dressing dry and intact to right groin, oriented to unit, call light placed within reach

## 2014-10-27 NOTE — Progress Notes (Signed)
Subjective: Patient s/p successful DEB-TACE of primary [place] today. He denies any change in baseline pre-procedural 6/10 abdominal pain. He denies any nausea or vomiting. He denies any right groin site pain or bleeding. He denies any chest pain or shortness of breath.   Allergies: Penicillins; Tylenol; and Codeine  Medications: Prior to Admission medications   Medication Sig Start Date End Date Taking? Authorizing Provider  fludrocortisone (FLORINEF) 0.1 MG tablet Take 0.1 mg by mouth daily.   Yes Historical Provider, MD  folic acid (FOLVITE) 1 MG tablet Take 1 mg by mouth daily.   Yes Historical Provider, MD  furosemide (LASIX) 40 MG tablet Take 1 tablet (40 mg total) by mouth daily. 09/09/14  Yes Volanda Napoleon, MD  oxyCODONE (ROXICODONE) 5 MG immediate release tablet Take 1-2 pills, if needed, every 6 hours for pain secondary to liver cancer. 10/10/14  Yes Volanda Napoleon, MD  pantoprazole (PROTONIX) 40 MG tablet Take 40 mg by mouth daily.   Yes Historical Provider, MD  propranolol (INDERAL) 10 MG tablet Take 1 tablet by mouth 2 (two) times daily. 12/17/12  Yes Historical Provider, MD  spironolactone (ALDACTONE) 50 MG tablet Take 1 tablet by mouth daily.  12/16/12  Yes Historical Provider, MD  fentaNYL (DURAGESIC - DOSED MCG/HR) 25 MCG/HR patch Place 1 patch (25 mcg total) onto the skin every 3 (three) days. 10/10/14   Volanda Napoleon, MD   Vital Signs: BP 133/64 mmHg  Pulse 45  Temp(Src) 97.9 F (36.6 C) (Oral)  Resp 16  Ht 5\' 6"  (1.676 m)  Wt 114 lb 4 oz (51.823 kg)  BMI 18.45 kg/m2  SpO2 100%  Physical Exam General: A&Ox3, NAD, Lying flat in bed Abd: Soft, RUQ TTP, ND Ext: RCFA dressing C/D/I, minimal TTP, no signs of bleeding/hematoma, DP 1+ b/l, no edema, warm b/l Foley: Clear yellow urine   Imaging: Ir Angiogram Visceral Selective  10/27/2014   CLINICAL DATA:  64 year old male with HCV and alcoholic cirrhosis complicated by [place]. He presents for chemo embolization with  drug-eluting beads.  EXAM: IR EMBO TUMOR ORGAN ISCHEMIA INFARCT INC GUIDE ROADMAPPING; ADDITIONAL ARTERIOGRAPHY; IR ULTRASOUND GUIDANCE VASC ACCESS RIGHT; SELECTIVE VISCERAL ARTERIOGRAPHY  Date: 10/27/2014  PROCEDURE: 1. Ultrasound-guided puncture right common femoral artery 2. Catheterization of the superior mesenteric artery with arteriogram (1st order) 3. Catheterization of the replaced right hepatic artery with arteriogram (3rd order) 4. Catheterization of hyper trophic caudate lobe artery with arteriogram (+ add'l) 5. Chemo embolization using approximately 120 mg of doxorubicin labeled to 100 -300 micron LC beads 6. Catheterization of common trunk of the segment 7 8 and 6 hepatic arteries with arteriogram (+ add'l) 7. Catheterization of the common trunk of the segment 7/8 hepatic artery with arteriogram (+ add'l) 8. Catheterization of a branch arteries supplying arterial flow to the lateral and superior aspect of the caudate tumor with arteriogram (+ add'l) 9. Chemoembolization using approximately 30 mg doxorubicin labeled to 100 -300 micron LC beads 10. Limited right common femoral arteriogram 11. Application of Cordis ExoSeal device Interventional Radiologist:  Criselda Peaches, MD  ANESTHESIA/SEDATION: Moderate (conscious) sedation was used. 0.5 mg Versed, 25 mcg Fentanyl were administered intravenously. The patient's vital signs were monitored continuously by radiology nursing throughout the procedure.  Sedation Time: 102 minutes  MEDICATIONS: 400 mg ciprofloxacin and 500 mg Flagyl administered within 1 hour of skin incision. Additionally, patient received 4 mg IV Zofran and 10 mg IV Decadron  FLUOROSCOPY TIME:  29 minutes 35 seconds  3488  mGy  CONTRAST:  100 mL Omnipaque 350 administered intra-arterially  TECHNIQUE: Informed consent was obtained from the patient following explanation of the procedure, risks, benefits and alternatives. The patient understands, agrees and consents for the procedure. All  questions were addressed. A time out was performed.  Maximal barrier sterile technique utilized including caps, mask, sterile gowns, sterile gloves, large sterile drape, hand hygiene, and Betadine skin prep.  The right groin was interrogated with ultrasound. The right common femoral artery is widely patent. Local anesthesia was attained by infiltration with 1% lidocaine. A small dermatotomy was made. Using standard technique, the vessel was punctured with a 21 gauge micropuncture needle an the initial micro sheath exchanged for a working 5 [place] vascular sheath over a Bentson wire. A C2 cobra catheter was advanced into the abdominal aorta over the Bentson wire. The cobra catheter was used to select the superior mesenteric artery. A superior mesenteric arteriogram was performed. As suggested on the prior CT scan, the right hepatic artery is replaced to the superior mesenteric artery.  The C2 cobra catheter was exchanged for an Omni selective catheter. This was again used to select the superior mesenteric artery and navigated into the common origin of the replaced right hepatic artery and middle colloid artery. A high-flow Renegade micro catheter was then advanced into the replaced right hepatic artery. A right hepatic arteriogram was performed. A large intensely hypervascular mass is present in the central aspect of the hepatic dome consistent with the known hepatocellular carcinoma.  Additional right hepatic arteriography was performed in multiple obliquities to fully evaluate the vascular anatomy.  The micro catheter was super selectively advanced into a hyper trophic caudate artery. Arteriogram confirmed that this artery supplies approximately 75-80% of the hepatoma including the inferior, medial and superomedial aspects. Chemo embolization was then performed. Approximately 80% of the total chemo embolic dose was administered into this artery. The approximate dose is 120 mg doxorubicin labeled to 100 -300 micron  LC beads. There was persistent antegrade flow within the artery following complete deposition of the chemo embolic dose.  The micro catheter was carefully flushed and brought back into the right hepatic artery. The catheter was next advanced into the common trunk of the hepatic segment 7/8/6 artery. Hepatic arteriography was performed in several obliquities. It appears that the remaining portion of the hepatoma is supplied by a branch artery arising from the proximal aspect of the segment 7/8 branch.  The micro catheter was advanced into the segment 7/8 branching arteriography performed. This confirmed the origin of the hepatoma artery. With some difficulty, the micro catheter was super selectively advanced into this unnamed hepatoma branch artery. Arteriography confirmed tumor blush matching the defect in the lateral and superolateral aspects of a hepatoma. Chemo embolization was then performed using the remaining 30 mg doxorubicin labeled to 100 - 300 micron LC beads. There was near stasis in the vessel following administration of the dose.  The micro catheter and 5 French catheter were removed. A limited right common femoral arteriogram was performed confirming common femoral arterial access. Hemostasis was attained with the application of a Cordis ExoSeal extra arterial vascular plug. The patient tolerated the procedure well.  COMPLICATIONS: None  Estimated blood loss:  0  IMPRESSION: 1. Successful chemo embolization with drug-eluting beads. A total of 150 mg doxorubicin labeled to 100 -300 micron LC beads was administered into the tumor. 80% of the dose was administered into the dominant arterial branch with 20% of the dose administered into a second smaller  arterial branch supplying the superior and lateral aspects of the hepatoma.  PLAN: 1. Admitted for observation 2. Clinic visit with liver labs and MRI abdomen in 4 weeks  Signed,  Criselda Peaches, MD  [facility]   [facility] Radiology   Electronically Signed   By: Jacqulynn Cadet M.D.   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 He presents for chemo embolization with drug-eluting beads.  EXAM: IR EMBO TUMOR ORGAN ISCHEMIA INFARCT INC GUIDE ROADMAPPING; ADDITIONAL ARTERIOGRAPHY; IR ULTRASOUND GUIDANCE VASC ACCESS RIGHT; SELECTIVE VISCERAL ARTERIOGRAPHY  Date: 10/27/2014  PROCEDURE: 1. Ultrasound-guided puncture right common femoral artery 2. Catheterization of the superior mesenteric artery with arteriogram (1st order) 3. Catheterization of the replaced right hepatic artery with arteriogram (3rd order) 4. Catheterization of hyper trophic caudate lobe artery with arteriogram (+ add'l) 5. Chemo embolization using approximately 120 mg of doxorubicin labeled to 100 -300 micron LC beads 6. Catheterization of common trunk of the segment 7 8 and 6 hepatic arteries with arteriogram (+ add'l) 7. Catheterization of the common trunk of the segment 7/8 hepatic artery with arteriogram (+ add'l) 8. Catheterization of a branch arteries supplying arterial flow to the lateral and superior aspect of the caudate tumor with arteriogram (+ add'l) 9. Chemoembolization using approximately 30 mg doxorubicin labeled to 100 -300 micron LC beads 10. Limited right common femoral arteriogram 11. Application of Cordis ExoSeal device Interventional Radiologist:  Criselda Peaches, MD  ANESTHESIA/SEDATION: Moderate (conscious) sedation was used. 0.5 mg Versed, 25 mcg Fentanyl were administered intravenously. The patient's vital signs were monitored continuously by radiology nursing throughout the procedure.  Sedation Time: 102 minutes  MEDICATIONS: 400 mg ciprofloxacin and 500 mg Flagyl administered within 1 hour of skin incision. Additionally, patient received 4 mg IV Zofran and 10 mg IV Decadron  FLUOROSCOPY TIME:  29 minutes  35 seconds  3488 mGy  CONTRAST:  100 mL Omnipaque 350 administered intra-arterially  TECHNIQUE: Informed consent was obtained from the patient following explanation of the procedure, risks, benefits and alternatives. The patient understands, agrees and consents for the procedure. All questions were addressed. A time out was performed.  Maximal barrier sterile technique utilized including caps, mask, sterile gowns, sterile gloves, large sterile drape, hand hygiene, and Betadine skin prep.  The right groin was interrogated with ultrasound. The right common femoral artery is widely patent. Local anesthesia was attained by infiltration with 1% lidocaine. A small dermatotomy was made. Using standard technique, the vessel was punctured with a 21 gauge micropuncture needle an the initial micro sheath exchanged for a working 5 [place] vascular sheath over a Bentson wire. A C2 cobra catheter was advanced into the abdominal aorta over the Bentson wire. The cobra catheter was used to select the superior mesenteric artery. A superior mesenteric arteriogram was performed. As suggested on the prior CT scan, the right hepatic artery is replaced to the superior mesenteric artery.  The C2 cobra catheter was exchanged for an Omni selective catheter. This was again used to select the superior mesenteric artery and navigated into the common origin of the replaced right hepatic artery and middle colloid artery. A high-flow Renegade micro catheter was then advanced into the replaced right hepatic artery. A right hepatic arteriogram was performed. A large intensely hypervascular mass is present in the central aspect of the hepatic dome consistent with the known hepatocellular carcinoma.  Additional right hepatic arteriography was performed in multiple obliquities to fully evaluate the vascular anatomy.  The micro catheter was super selectively advanced into a hyper trophic caudate artery. Arteriogram confirmed that this artery supplies  approximately 75-80% of the hepatoma including the inferior, medial and superomedial aspects. Chemo embolization was then performed. Approximately 80% of the total chemo embolic dose was administered into this artery. The approximate dose is 120 mg doxorubicin labeled to 100 -300 micron LC beads. There was persistent antegrade flow within the artery following complete deposition of the chemo embolic dose.  The micro catheter was carefully flushed and brought back into the right hepatic artery. The catheter was next advanced into the common trunk of the hepatic segment 7/8/6 artery. Hepatic arteriography was performed in several obliquities. It appears that the remaining portion of the hepatoma is supplied by a branch artery arising from the proximal aspect of the segment 7/8 branch.  The micro catheter was advanced into the segment 7/8 branching arteriography performed. This confirmed the origin of the hepatoma artery. With some difficulty, the micro catheter was super selectively advanced into this unnamed hepatoma branch artery. Arteriography confirmed tumor blush matching the defect in the lateral and superolateral aspects of a hepatoma. Chemo embolization was then performed using the remaining 30 mg doxorubicin labeled to 100 - 300 micron LC beads. There was near stasis in the vessel following administration of the dose.  The micro catheter and 5 French catheter were removed. A limited right common femoral arteriogram was performed confirming common femoral arterial access. Hemostasis was attained with the application of a Cordis ExoSeal extra arterial vascular plug. The patient tolerated the procedure well.  COMPLICATIONS: None  Estimated blood loss:  0  IMPRESSION: 1. Successful chemo embolization with drug-eluting beads. A total of 150 mg doxorubicin labeled to 100 -300 micron LC beads was administered into the tumor. 80% of the dose was administered into the dominant arterial branch with 20% of the dose  administered into a second smaller arterial branch supplying the superior and lateral aspects of the hepatoma.  PLAN: 1. Admitted for observation 2. Clinic visit with liver labs and MRI abdomen in 4 weeks  Signed,  Criselda Peaches, MD  [facility]  [facility] Radiology   Electronically Signed   By: Jacqulynn Cadet M.D.   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 He presents for chemo embolization with drug-eluting beads.  EXAM: IR EMBO TUMOR ORGAN ISCHEMIA INFARCT INC GUIDE ROADMAPPING; ADDITIONAL ARTERIOGRAPHY; IR ULTRASOUND GUIDANCE VASC ACCESS RIGHT; SELECTIVE VISCERAL ARTERIOGRAPHY  Date: 10/27/2014  PROCEDURE: 1. Ultrasound-guided puncture right common femoral artery 2. Catheterization of the superior mesenteric artery with arteriogram (1st order) 3. Catheterization of the replaced right hepatic artery with arteriogram (3rd order) 4. Catheterization of hyper trophic caudate lobe artery with arteriogram (+ add'l) 5. Chemo embolization using approximately 120 mg of doxorubicin labeled to 100 -300 micron LC beads 6. Catheterization of common trunk of the segment 7 8 and 6 hepatic arteries with arteriogram (+ add'l) 7. Catheterization of the common trunk of the segment 7/8 hepatic artery with arteriogram (+ add'l) 8. Catheterization of a branch arteries supplying arterial flow to the lateral and superior aspect of the caudate tumor with arteriogram (+ add'l) 9. Chemoembolization using approximately 30 mg doxorubicin labeled to 100 -300 micron LC beads 10. Limited right common femoral arteriogram 11.  Application of Cordis ExoSeal device Interventional Radiologist:  Criselda Peaches, MD  ANESTHESIA/SEDATION: Moderate (conscious) sedation was used. 0.5 mg Versed, 25 mcg Fentanyl were administered intravenously. The patient's vital signs were  monitored continuously by radiology nursing throughout the procedure.  Sedation Time: 102 minutes  MEDICATIONS: 400 mg ciprofloxacin and 500 mg Flagyl administered within 1 hour of skin incision. Additionally, patient received 4 mg IV Zofran and 10 mg IV Decadron  FLUOROSCOPY TIME:  29 minutes 35 seconds  3488 mGy  CONTRAST:  100 mL Omnipaque 350 administered intra-arterially  TECHNIQUE: Informed consent was obtained from the patient following explanation of the procedure, risks, benefits and alternatives. The patient understands, agrees and consents for the procedure. All questions were addressed. A time out was performed.  Maximal barrier sterile technique utilized including caps, mask, sterile gowns, sterile gloves, large sterile drape, hand hygiene, and Betadine skin prep.  The right groin was interrogated with ultrasound. The right common femoral artery is widely patent. Local anesthesia was attained by infiltration with 1% lidocaine. A small dermatotomy was made. Using standard technique, the vessel was punctured with a 21 gauge micropuncture needle an the initial micro sheath exchanged for a working 5 [place] vascular sheath over a Bentson wire. A C2 cobra catheter was advanced into the abdominal aorta over the Bentson wire. The cobra catheter was used to select the superior mesenteric artery. A superior mesenteric arteriogram was performed. As suggested on the prior CT scan, the right hepatic artery is replaced to the superior mesenteric artery.  The C2 cobra catheter was exchanged for an Omni selective catheter. This was again used to select the superior mesenteric artery and navigated into the common origin of the replaced right hepatic artery and middle colloid artery. A high-flow Renegade micro catheter was then advanced into the replaced right hepatic artery. A right hepatic arteriogram was performed. A large intensely hypervascular mass is present in the central aspect of the hepatic dome consistent  with the known hepatocellular carcinoma.  Additional right hepatic arteriography was performed in multiple obliquities to fully evaluate the vascular anatomy.  The micro catheter was super selectively advanced into a hyper trophic caudate artery. Arteriogram confirmed that this artery supplies approximately 75-80% of the hepatoma including the inferior, medial and superomedial aspects. Chemo embolization was then performed. Approximately 80% of the total chemo embolic dose was administered into this artery. The approximate dose is 120 mg doxorubicin labeled to 100 -300 micron LC beads. There was persistent antegrade flow within the artery following complete deposition of the chemo embolic dose.  The micro catheter was carefully flushed and brought back into the right hepatic artery. The catheter was next advanced into the common trunk of the hepatic segment 7/8/6 artery. Hepatic arteriography was performed in several obliquities. It appears that the remaining portion of the hepatoma is supplied by a branch artery arising from the proximal aspect of the segment 7/8 branch.  The micro catheter was advanced into the segment 7/8 branching arteriography performed. This confirmed the origin of the hepatoma artery. With some difficulty, the micro catheter was super selectively advanced into this unnamed hepatoma branch artery. Arteriography confirmed tumor blush matching the defect in the lateral and superolateral aspects of a hepatoma. Chemo embolization was then performed using the remaining 30 mg doxorubicin labeled to 100 - 300 micron LC beads. There was near stasis in the vessel following administration of the dose.  The micro catheter and 5 French catheter were removed. A limited right common femoral arteriogram  was performed confirming common femoral arterial access. Hemostasis was attained with the application of a Cordis ExoSeal extra arterial vascular plug. The patient tolerated the procedure well.   COMPLICATIONS: None  Estimated blood loss:  0  IMPRESSION: 1. Successful chemo embolization with drug-eluting beads. A total of 150 mg doxorubicin labeled to 100 -300 micron LC beads was administered into the tumor. 80% of the dose was administered into the dominant arterial branch with 20% of the dose administered into a second smaller arterial branch supplying the superior and lateral aspects of the hepatoma.  PLAN: 1. Admitted for observation 2. Clinic visit with liver labs and MRI abdomen in 4 weeks  Signed,  Criselda Peaches, MD  [facility]  [facility]iology   Electronically Signed   By: Jacqulynn Cadet M.D.   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 He presents for chemo embolization with drug-eluting beads.  EXAM: IR EMBO TUMOR ORGAN ISCHEMIA INFARCT INC GUIDE ROADMAPPING; ADDITIONAL ARTERIOGRAPHY; IR ULTRASOUND GUIDANCE VASC ACCESS RIGHT; SELECTIVE VISCERAL ARTERIOGRAPHY  Date: 10/27/2014  PROCEDURE: 1. Ultrasound-guided puncture right common femoral artery 2. Catheterization of the superior mesenteric artery with arteriogram (1st order) 3. Catheterization of the replaced right hepatic artery with arteriogram (3rd order) 4. Catheterization of hyper trophic caudate lobe artery with arteriogram (+ add'l) 5. Chemo embolization using approximately 120 mg of doxorubicin labeled to 100 -300 micron LC beads 6. Catheterization of common trunk of the segment 7 8 and 6 hepatic arteries with arteriogram (+ add'l) 7. Catheterization of the common trunk of the segment 7/8 hepatic artery with arteriogram (+ add'l) 8. Catheterization of a branch arteries supplying arterial flow to the lateral and superior aspect of the caudate tumor with arteriogram (+ add'l) 9. Chemoembolization using approximately 30 mg doxorubicin labeled to 100 -300 micron LC  beads 10. Limited right common femoral arteriogram 11. Application of Cordis ExoSeal device Interventional Radiologist:  Criselda Peaches, MD  ANESTHESIA/SEDATION: Moderate (conscious) sedation was used. 0.5 mg Versed, 25 mcg Fentanyl were administered intravenously. The patient's vital signs were monitored continuously by radiology nursing throughout the procedure.  Sedation Time: 102 minutes  MEDICATIONS: 400 mg ciprofloxacin and 500 mg Flagyl administered within 1 hour of skin incision. Additionally, patient received 4 mg IV Zofran and 10 mg IV Decadron  FLUOROSCOPY TIME:  29 minutes 35 seconds  3488 mGy  CONTRAST:  100 mL Omnipaque 350 administered intra-arterially  TECHNIQUE: Informed consent was obtained from the patient following explanation of the procedure, risks, benefits and alternatives. The patient understands, agrees and consents for the procedure. All questions were addressed. A time out was performed.  Maximal barrier sterile technique utilized including caps, mask, sterile gowns, sterile gloves, large sterile drape, hand hygiene, and Betadine skin prep.  The right groin was interrogated with ultrasound. The right common femoral artery is widely patent. Local anesthesia was attained by infiltration with 1% lidocaine. A small dermatotomy was made. Using standard technique, the vessel was punctured with a 21 gauge micropuncture needle an the initial micro sheath exchanged for a working 5 [place] vascular sheath over a Bentson wire. A C2 cobra catheter was advanced into the abdominal aorta over the Bentson wire. The cobra catheter was used to select the superior mesenteric artery. A superior mesenteric arteriogram was performed. As suggested on the prior CT scan, the right hepatic artery is replaced to  the superior mesenteric artery.  The C2 cobra catheter was exchanged for an Omni selective catheter. This was again used to select the superior mesenteric artery and navigated into the common origin of  the replaced right hepatic artery and middle colloid artery. A high-flow Renegade micro catheter was then advanced into the replaced right hepatic artery. A right hepatic arteriogram was performed. A large intensely hypervascular mass is present in the central aspect of the hepatic dome consistent with the known hepatocellular carcinoma.  Additional right hepatic arteriography was performed in multiple obliquities to fully evaluate the vascular anatomy.  The micro catheter was super selectively advanced into a hyper trophic caudate artery. Arteriogram confirmed that this artery supplies approximately 75-80% of the hepatoma including the inferior, medial and superomedial aspects. Chemo embolization was then performed. Approximately 80% of the total chemo embolic dose was administered into this artery. The approximate dose is 120 mg doxorubicin labeled to 100 -300 micron LC beads. There was persistent antegrade flow within the artery following complete deposition of the chemo embolic dose.  The micro catheter was carefully flushed and brought back into the right hepatic artery. The catheter was next advanced into the common trunk of the hepatic segment 7/8/6 artery. Hepatic arteriography was performed in several obliquities. It appears that the remaining portion of the hepatoma is supplied by a branch artery arising from the proximal aspect of the segment 7/8 branch.  The micro catheter was advanced into the segment 7/8 branching arteriography performed. This confirmed the origin of the hepatoma artery. With some difficulty, the micro catheter was super selectively advanced into this unnamed hepatoma branch artery. Arteriography confirmed tumor blush matching the defect in the lateral and superolateral aspects of a hepatoma. Chemo embolization was then performed using the remaining 30 mg doxorubicin labeled to 100 - 300 micron LC beads. There was near stasis in the vessel following administration of the dose.  The  micro catheter and 5 French catheter were removed. A limited right common femoral arteriogram was performed confirming common femoral arterial access. Hemostasis was attained with the application of a Cordis ExoSeal extra arterial vascular plug. The patient tolerated the procedure well.  COMPLICATIONS: None  Estimated blood loss:  0  IMPRESSION: 1. Successful chemo embolization with drug-eluting beads. A total of 150 mg doxorubicin labeled to 100 -300 micron LC beads was administered into the tumor. 80% of the dose was administered into the dominant arterial branch with 20% of the dose administered into a second smaller arterial branch supplying the superior and lateral aspects of the hepatoma.  PLAN: 1. Admitted for observation 2. Clinic visit with liver labs and MRI abdomen in 4 weeks  Signed,  Criselda Peaches, MD  [facility]  [facility]   Electronically Signed   By: Jacqulynn Cadet M.D.   On: 10/27/2014 16:17   Ir Angiogram Selective Each Additional Vessel  10/27/2014   CLINICAL DATA:  64 year old male with HCV and alcoholic cirrhosis complicated by [facility]. He presents for chemo embolization with drug-eluting beads.  EXAM: IR EMBO TUMOR ORGAN ISCHEMIA INFARCT INC GUIDE ROADMAPPING; ADDITIONAL ARTERIOGRAPHY; IR ULTRASOUND GUIDANCE VASC ACCESS RIGHT; SELECTIVE VISCERAL ARTERIOGRAPHY  Date: 10/27/2014  PROCEDURE: 1. Ultrasound-guided puncture right common femoral artery 2. Catheterization of the superior mesenteric artery with arteriogram (1st order) 3. Catheterization of the replaced right hepatic artery with arteriogram (3rd order) 4. Catheterization of hyper trophic caudate lobe artery with arteriogram (+ add'l) 5. Chemo embolization using approximately 120 mg of doxorubicin labeled  to 100 -300 micron LC beads 6. Catheterization of common trunk of the segment 7 8 and 6 hepatic arteries with arteriogram (+ add'l) 7. Catheterization of the common trunk of the  segment 7/8 hepatic artery with arteriogram (+ add'l) 8. Catheterization of a branch arteries supplying arterial flow to the lateral and superior aspect of the caudate tumor with arteriogram (+ add'l) 9. Chemoembolization using approximately 30 mg doxorubicin labeled to 100 -300 micron LC beads 10. Limited right common femoral arteriogram 11. Application of Cordis ExoSeal device Interventional Radiologist:  Criselda Peaches, MD  ANESTHESIA/SEDATION: Moderate (conscious) sedation was used. 0.5 mg Versed, 25 mcg Fentanyl were administered intravenously. The patient's vital signs were monitored continuously by radiology nursing throughout the procedure.  Sedation Time: 102 minutes  MEDICATIONS: 400 mg ciprofloxacin and 500 mg Flagyl administered within 1 hour of skin incision. Additionally, patient received 4 mg IV Zofran and 10 mg IV Decadron  FLUOROSCOPY TIME:  29 minutes 35 seconds  3488 mGy  CONTRAST:  100 mL Omnipaque 350 administered intra-arterially  TECHNIQUE: Informed consent was obtained from the patient following explanation of the procedure, risks, benefits and alternatives. The patient understands, agrees and consents for the procedure. All questions were addressed. A time out was performed.  Maximal barrier sterile technique utilized including caps, mask, sterile gowns, sterile gloves, large sterile drape, hand hygiene, and Betadine skin prep.  The right groin was interrogated with ultrasound. The right common femoral artery is widely patent. Local anesthesia was attained by infiltration with 1% lidocaine. A small dermatotomy was made. Using standard technique, the vessel was punctured with a 21 gauge micropuncture needle an the initial micro sheath exchanged for a working 5 [place] vascular sheath over a Bentson wire. A C2 cobra catheter was advanced into the abdominal aorta over the Bentson wire. The cobra catheter was used to select the superior mesenteric artery. A superior mesenteric arteriogram  was performed. As suggested on the prior CT scan, the right hepatic artery is replaced to the superior mesenteric artery.  The C2 cobra catheter was exchanged for an Omni selective catheter. This was again used to select the superior mesenteric artery and navigated into the common origin of the replaced right hepatic artery and middle colloid artery. A high-flow Renegade micro catheter was then advanced into the replaced right hepatic artery. A right hepatic arteriogram was performed. A large intensely hypervascular mass is present in the central aspect of the hepatic dome consistent with the known hepatocellular carcinoma.  Additional right hepatic arteriography was performed in multiple obliquities to fully evaluate the vascular anatomy.  The micro catheter was super selectively advanced into a hyper trophic caudate artery. Arteriogram confirmed that this artery supplies approximately 75-80% of the hepatoma including the inferior, medial and superomedial aspects. Chemo embolization was then performed. Approximately 80% of the total chemo embolic dose was administered into this artery. The approximate dose is 120 mg doxorubicin labeled to 100 -300 micron LC beads. There was persistent antegrade flow within the artery following complete deposition of the chemo embolic dose.  The micro catheter was carefully flushed and brought back into the right hepatic artery. The catheter was next advanced into the common trunk of the hepatic segment 7/8/6 artery. Hepatic arteriography was performed in several obliquities. It appears that the remaining portion of the hepatoma is supplied by a branch artery arising from the proximal aspect of the segment 7/8 branch.  The micro catheter was advanced into the segment 7/8 branching arteriography performed. This confirmed the  origin of the hepatoma artery. With some difficulty, the micro catheter was super selectively advanced into this unnamed hepatoma branch artery. Arteriography  confirmed tumor blush matching the defect in the lateral and superolateral aspects of a hepatoma. Chemo embolization was then performed using the remaining 30 mg doxorubicin labeled to 100 - 300 micron LC beads. There was near stasis in the vessel following administration of the dose.  The micro catheter and 5 French catheter were removed. A limited right common femoral arteriogram was performed confirming common femoral arterial access. Hemostasis was attained with the application of a Cordis ExoSeal extra arterial vascular plug. The patient tolerated the procedure well.  COMPLICATIONS: None  Estimated blood loss:  0  IMPRESSION: 1. Successful chemo embolization with drug-eluting beads. A total of 150 mg doxorubicin labeled to 100 -300 micron LC beads was administered into the tumor. 80% of the dose was administered into the dominant arterial branch with 20% of the dose administered into a second smaller arterial branch supplying the superior and lateral aspects of the hepatoma.  PLAN: 1. Admitted for observation 2. Clinic visit with liver labs and MRI abdomen in 4 weeks  Signed,  Criselda Peaches, MD  [facility]  [facility] Radiology   Electronically Signed   By: Jacqulynn Cadet M.D.   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 He presents for chemo embolization with drug-eluting beads.  EXAM: IR EMBO TUMOR ORGAN ISCHEMIA INFARCT INC GUIDE ROADMAPPING; ADDITIONAL ARTERIOGRAPHY; IR ULTRASOUND GUIDANCE VASC ACCESS RIGHT; SELECTIVE VISCERAL ARTERIOGRAPHY  Date: 10/27/2014  PROCEDURE: 1. Ultrasound-guided puncture right common femoral artery 2. Catheterization of the superior mesenteric artery with arteriogram (1st order) 3. Catheterization of the replaced right hepatic artery with arteriogram (3rd order) 4. Catheterization of hyper trophic caudate  lobe artery with arteriogram (+ add'l) 5. Chemo embolization using approximately 120 mg of doxorubicin labeled to 100 -300 micron LC beads 6. Catheterization of common trunk of the segment 7 8 and 6 hepatic arteries with arteriogram (+ add'l) 7. Catheterization of the common trunk of the segment 7/8 hepatic artery with arteriogram (+ add'l) 8. Catheterization of a branch arteries supplying arterial flow to the lateral and superior aspect of the caudate tumor with arteriogram (+ add'l) 9. Chemoembolization using approximately 30 mg doxorubicin labeled to 100 -300 micron LC beads 10. Limited right common femoral arteriogram 11. Application of Cordis ExoSeal device Interventional Radiologist:  Criselda Peaches, MD  ANESTHESIA/SEDATION: Moderate (conscious) sedation was used. 0.5 mg Versed, 25 mcg Fentanyl were administered intravenously. The patient's vital signs were monitored continuously by radiology nursing throughout the procedure.  Sedation Time: 102 minutes  MEDICATIONS: 400 mg ciprofloxacin and 500 mg Flagyl administered within 1 hour of skin incision. Additionally, patient received 4 mg IV Zofran and 10 mg IV Decadron  FLUOROSCOPY TIME:  29 minutes 35 seconds  3488 mGy  CONTRAST:  100 mL Omnipaque 350 administered intra-arterially  TECHNIQUE: Informed consent was obtained from the patient following explanation of the procedure, risks, benefits and alternatives. The patient understands, agrees and consents for the procedure. All questions were addressed. A time out was performed.  Maximal barrier sterile technique utilized including caps, mask, sterile gowns, sterile gloves, large sterile drape, hand hygiene, and Betadine skin prep.  The right groin was interrogated with ultrasound. The right common femoral artery is widely patent. Local anesthesia was attained by  infiltration with 1% lidocaine. A small dermatotomy was made. Using standard technique, the vessel was punctured with a 21 gauge micropuncture  needle an the initial micro sheath exchanged for a working 5 [place] vascular sheath over a Bentson wire. A C2 cobra catheter was advanced into the abdominal aorta over the Bentson wire. The cobra catheter was used to select the superior mesenteric artery. A superior mesenteric arteriogram was performed. As suggested on the prior CT scan, the right hepatic artery is replaced to the superior mesenteric artery.  The C2 cobra catheter was exchanged for an Omni selective catheter. This was again used to select the superior mesenteric artery and navigated into the common origin of the replaced right hepatic artery and middle colloid artery. A high-flow Renegade micro catheter was then advanced into the replaced right hepatic artery. A right hepatic arteriogram was performed. A large intensely hypervascular mass is present in the central aspect of the hepatic dome consistent with the known hepatocellular carcinoma.  Additional right hepatic arteriography was performed in multiple obliquities to fully evaluate the vascular anatomy.  The micro catheter was super selectively advanced into a hyper trophic caudate artery. Arteriogram confirmed that this artery supplies approximately 75-80% of the hepatoma including the inferior, medial and superomedial aspects. Chemo embolization was then performed. Approximately 80% of the total chemo embolic dose was administered into this artery. The approximate dose is 120 mg doxorubicin labeled to 100 -300 micron LC beads. There was persistent antegrade flow within the artery following complete deposition of the chemo embolic dose.  The micro catheter was carefully flushed and brought back into the right hepatic artery. The catheter was next advanced into the common trunk of the hepatic segment 7/8/6 artery. Hepatic arteriography was performed in several obliquities. It appears that the remaining portion of the hepatoma is supplied by a branch artery arising from the proximal aspect of the  segment 7/8 branch.  The micro catheter was advanced into the segment 7/8 branching arteriography performed. This confirmed the origin of the hepatoma artery. With some difficulty, the micro catheter was super selectively advanced into this unnamed hepatoma branch artery. Arteriography confirmed tumor blush matching the defect in the lateral and superolateral aspects of a hepatoma. Chemo embolization was then performed using the remaining 30 mg doxorubicin labeled to 100 - 300 micron LC beads. There was near stasis in the vessel following administration of the dose.  The micro catheter and 5 French catheter were removed. A limited right common femoral arteriogram was performed confirming common femoral arterial access. Hemostasis was attained with the application of a Cordis ExoSeal extra arterial vascular plug. The patient tolerated the procedure well.  COMPLICATIONS: None  Estimated blood loss:  0  IMPRESSION: 1. Successful chemo embolization with drug-eluting beads. A total of 150 mg doxorubicin labeled to 100 -300 micron LC beads was administered into the tumor. 80% of the dose was administered into the dominant arterial branch with 20% of the dose administered into a second smaller arterial branch supplying the superior and lateral aspects of the hepatoma.  PLAN: 1. Admitted for observation 2. Clinic visit with liver labs and MRI abdomen in 4 weeks  Signed,  Criselda Peaches, MD  [facility]  [facility] Radiology   Electronically Signed   By: Jacqulynn Cadet M.D.   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 He presents for  chemo embolization with drug-eluting beads.  EXAM: IR EMBO TUMOR ORGAN ISCHEMIA INFARCT INC GUIDE ROADMAPPING; ADDITIONAL ARTERIOGRAPHY; IR ULTRASOUND GUIDANCE VASC ACCESS RIGHT; SELECTIVE VISCERAL ARTERIOGRAPHY  Date: 10/27/2014   PROCEDURE: 1. Ultrasound-guided puncture right common femoral artery 2. Catheterization of the superior mesenteric artery with arteriogram (1st order) 3. Catheterization of the replaced right hepatic artery with arteriogram (3rd order) 4. Catheterization of hyper trophic caudate lobe artery with arteriogram (+ add'l) 5. Chemo embolization using approximately 120 mg of doxorubicin labeled to 100 -300 micron LC beads 6. Catheterization of common trunk of the segment 7 8 and 6 hepatic arteries with arteriogram (+ add'l) 7. Catheterization of the common trunk of the segment 7/8 hepatic artery with arteriogram (+ add'l) 8. Catheterization of a branch arteries supplying arterial flow to the lateral and superior aspect of the caudate tumor with arteriogram (+ add'l) 9. Chemoembolization using approximately 30 mg doxorubicin labeled to 100 -300 micron LC beads 10. Limited right common femoral arteriogram 11. Application of Cordis ExoSeal device Interventional Radiologist:  Criselda Peaches, MD  ANESTHESIA/SEDATION: Moderate (conscious) sedation was used. 0.5 mg Versed, 25 mcg Fentanyl were administered intravenously. The patient's vital signs were monitored continuously by radiology nursing throughout the procedure.  Sedation Time: 102 minutes  MEDICATIONS: 400 mg ciprofloxacin and 500 mg Flagyl administered within 1 hour of skin incision. Additionally, patient received 4 mg IV Zofran and 10 mg IV Decadron  FLUOROSCOPY TIME:  29 minutes 35 seconds  3488 mGy  CONTRAST:  100 mL Omnipaque 350 administered intra-arterially  TECHNIQUE: Informed consent was obtained from the patient following explanation of the procedure, risks, benefits and alternatives. The patient understands, agrees and consents for the procedure. All questions were addressed. A time out was performed.  Maximal barrier sterile technique utilized including caps, mask, sterile gowns, sterile gloves, large sterile drape, hand hygiene, and Betadine skin prep.   The right groin was interrogated with ultrasound. The right common femoral artery is widely patent. Local anesthesia was attained by infiltration with 1% lidocaine. A small dermatotomy was made. Using standard technique, the vessel was punctured with a 21 gauge micropuncture needle an the initial micro sheath exchanged for a working 5 [place] vascular sheath over a Bentson wire. A C2 cobra catheter was advanced into the abdominal aorta over the Bentson wire. The cobra catheter was used to select the superior mesenteric artery. A superior mesenteric arteriogram was performed. As suggested on the prior CT scan, the right hepatic artery is replaced to the superior mesenteric artery.  The C2 cobra catheter was exchanged for an Omni selective catheter. This was again used to select the superior mesenteric artery and navigated into the common origin of the replaced right hepatic artery and middle colloid artery. A high-flow Renegade micro catheter was then advanced into the replaced right hepatic artery. A right hepatic arteriogram was performed. A large intensely hypervascular mass is present in the central aspect of the hepatic dome consistent with the known hepatocellular carcinoma.  Additional right hepatic arteriography was performed in multiple obliquities to fully evaluate the vascular anatomy.  The micro catheter was super selectively advanced into a hyper trophic caudate artery. Arteriogram confirmed that this artery supplies approximately 75-80% of the hepatoma including the inferior, medial and superomedial aspects. Chemo embolization was then performed. Approximately 80% of the total chemo embolic dose was administered into this artery. The approximate dose is 120 mg doxorubicin labeled to 100 -300 micron LC beads. There was persistent antegrade flow within the artery following complete  deposition of the chemo embolic dose.  The micro catheter was carefully flushed and brought back into the right hepatic  artery. The catheter was next advanced into the common trunk of the hepatic segment 7/8/6 artery. Hepatic arteriography was performed in several obliquities. It appears that the remaining portion of the hepatoma is supplied by a branch artery arising from the proximal aspect of the segment 7/8 branch.  The micro catheter was advanced into the segment 7/8 branching arteriography performed. This confirmed the origin of the hepatoma artery. With some difficulty, the micro catheter was super selectively advanced into this unnamed hepatoma branch artery. Arteriography confirmed tumor blush matching the defect in the lateral and superolateral aspects of a hepatoma. Chemo embolization was then performed using the remaining 30 mg doxorubicin labeled to 100 - 300 micron LC beads. There was near stasis in the vessel following administration of the dose.  The micro catheter and 5 French catheter were removed. A limited right common femoral arteriogram was performed confirming common femoral arterial access. Hemostasis was attained with the application of a Cordis ExoSeal extra arterial vascular plug. The patient tolerated the procedure well.  COMPLICATIONS: None  Estimated blood loss:  0  IMPRESSION: 1. Successful chemo embolization with drug-eluting beads. A total of 150 mg doxorubicin labeled to 100 -300 micron LC beads was administered into the tumor. 80% of the dose was administered into the dominant arterial branch with 20% of the dose administered into a second smaller arterial branch supplying the superior and lateral aspects of the hepatoma.  PLAN: 1. Admitted for observation 2. Clinic visit with liver labs and MRI abdomen in 4 weeks  Signed,  Criselda Peaches, MD  [facility]  [facility] Radiology   Electronically Signed   By: Jacqulynn Cadet M.D.   On: 10/27/2014 16:17   Ir Embo Tumor Organ Ischemia Infarct Inc Guide Roadmapping  10/27/2014   CLINICAL DATA:  64 year old  male with HCV and alcoholic cirrhosis complicated by [facility]. He presents for chemo embolization with drug-eluting beads.  EXAM: IR EMBO TUMOR ORGAN ISCHEMIA INFARCT INC GUIDE ROADMAPPING; ADDITIONAL ARTERIOGRAPHY; IR ULTRASOUND GUIDANCE VASC ACCESS RIGHT; SELECTIVE VISCERAL ARTERIOGRAPHY  Date: 10/27/2014  PROCEDURE: 1. Ultrasound-guided puncture right common femoral artery 2. Catheterization of the superior mesenteric artery with arteriogram (1st order) 3. Catheterization of the replaced right hepatic artery with arteriogram (3rd order) 4. Catheterization of hyper trophic caudate lobe artery with arteriogram (+ add'l) 5. Chemo embolization using approximately 120 mg of doxorubicin labeled to 100 -300 micron LC beads 6. Catheterization of common trunk of the segment 7 8 and 6 hepatic arteries with arteriogram (+ add'l) 7. Catheterization of the common trunk of the segment 7/8 hepatic artery with arteriogram (+ add'l) 8. Catheterization of a branch arteries supplying arterial flow to the lateral and superior aspect of the caudate tumor with arteriogram (+ add'l) 9. Chemoembolization using approximately 30 mg doxorubicin labeled to 100 -300 micron LC beads 10. Limited right common femoral arteriogram 11. Application of Cordis ExoSeal device Interventional Radiologist:  Criselda Peaches, MD  ANESTHESIA/SEDATION: Moderate (conscious) sedation was used. 0.5 mg Versed, 25 mcg Fentanyl were administered intravenously. The patient's vital signs were monitored continuously by radiology nursing throughout the procedure.  Sedation Time: 102 minutes  MEDICATIONS: 400 mg ciprofloxacin and 500 mg Flagyl administered within 1 hour of skin incision. Additionally, patient received 4 mg IV Zofran and 10 mg IV Decadron  FLUOROSCOPY TIME:  29 minutes 35 seconds  3488 mGy  CONTRAST:  100 mL Omnipaque 350 administered intra-arterially  TECHNIQUE: Informed consent was obtained from the patient following explanation of the procedure, risks,  benefits and alternatives. The patient understands, agrees and consents for the procedure. All questions were addressed. A time out was performed.  Maximal barrier sterile technique utilized including caps, mask, sterile gowns, sterile gloves, large sterile drape, hand hygiene, and Betadine skin prep.  The right groin was interrogated with ultrasound. The right common femoral artery is widely patent. Local anesthesia was attained by infiltration with 1% lidocaine. A small dermatotomy was made. Using standard technique, the vessel was punctured with a 21 gauge micropuncture needle an the initial micro sheath exchanged for a working 5 [place] vascular sheath over a Bentson wire. A C2 cobra catheter was advanced into the abdominal aorta over the Bentson wire. The cobra catheter was used to select the superior mesenteric artery. A superior mesenteric arteriogram was performed. As suggested on the prior CT scan, the right hepatic artery is replaced to the superior mesenteric artery.  The C2 cobra catheter was exchanged for an Omni selective catheter. This was again used to select the superior mesenteric artery and navigated into the common origin of the replaced right hepatic artery and middle colloid artery. A high-flow Renegade micro catheter was then advanced into the replaced right hepatic artery. A right hepatic arteriogram was performed. A large intensely hypervascular mass is present in the central aspect of the hepatic dome consistent with the known hepatocellular carcinoma.  Additional right hepatic arteriography was performed in multiple obliquities to fully evaluate the vascular anatomy.  The micro catheter was super selectively advanced into a hyper trophic caudate artery. Arteriogram confirmed that this artery supplies approximately 75-80% of the hepatoma including the inferior, medial and superomedial aspects. Chemo embolization was then performed. Approximately 80% of the total chemo embolic dose was  administered into this artery. The approximate dose is 120 mg doxorubicin labeled to 100 -300 micron LC beads. There was persistent antegrade flow within the artery following complete deposition of the chemo embolic dose.  The micro catheter was carefully flushed and brought back into the right hepatic artery. The catheter was next advanced into the common trunk of the hepatic segment 7/8/6 artery. Hepatic arteriography was performed in several obliquities. It appears that the remaining portion of the hepatoma is supplied by a branch artery arising from the proximal aspect of the segment 7/8 branch.  The micro catheter was advanced into the segment 7/8 branching arteriography performed. This confirmed the origin of the hepatoma artery. With some difficulty, the micro catheter was super selectively advanced into this unnamed hepatoma branch artery. Arteriography confirmed tumor blush matching the defect in the lateral and superolateral aspects of a hepatoma. Chemo embolization was then performed using the remaining 30 mg doxorubicin labeled to 100 - 300 micron LC beads. There was near stasis in the vessel following administration of the dose.  The micro catheter and 5 French catheter were removed. A limited right common femoral arteriogram was performed confirming common femoral arterial access. Hemostasis was attained with the application of a Cordis ExoSeal extra arterial vascular plug. The patient tolerated the procedure well.  COMPLICATIONS: None  Estimated blood loss:  0  IMPRESSION: 1. Successful chemo embolization with drug-eluting beads. A total of 150 mg doxorubicin labeled to 100 -300 micron LC beads was administered into the tumor. 80% of the dose was administered into the dominant arterial branch with 20% of the dose administered into a second smaller arterial branch  supplying the superior and lateral aspects of the hepatoma.  PLAN: 1. Admitted for observation 2. Clinic visit with liver labs and MRI  abdomen in 4 weeks  Signed,  Criselda Peaches, MD  [facility]  [facility]   Electronically Signed   By: Jacqulynn Cadet M.D.   On: 10/27/2014 16:17    Labs:  CBC:  Recent Labs  08/10/14 1243 08/18/14 1506 09/09/14 1317 10/27/14 0950  WBC 3.6* 3.8* 4.2 5.1  HGB 8.7 Repeated and verified X2.* 9.1* 8.6* 10.2*  HCT 25.7 Repeated and verified X2.* 27.2* 25.6* 30.2*  PLT 83.0* 76* 76* 86*    COAGS:  Recent Labs  10/27/14 0950  INR 1.16  APTT 34    BMP:  Recent Labs  08/03/14 1700 08/18/14 1507 09/09/14 1318 10/27/14 0950  NA 141 141 139 139  K 4.8 4.6 5.4* 3.9  CL 114* 115* 109* 105  CO2 22 19 19 25   GLUCOSE 97 123* 129* 115*  BUN 22 26* 17 37*  CALCIUM 11.3* 10.7* 10.7* 10.8*  CREATININE 1.09 1.20 1.1 1.68*  GFRNONAA  --   --   --  41*  GFRAA  --   --   --  47*    LIVER FUNCTION TESTS:  Recent Labs  08/03/14 1700 08/18/14 1507 09/09/14 1318 10/27/14 0950  BILITOT 0.9 0.8 1.00 1.2  AST 20 21 33 35  ALT 14 14 20 21   ALKPHOS 80 61 74 69  PROT 7.7 6.9 7.1 8.4*  ALBUMIN 3.5 3.1*  --  3.9    Assessment and Plan: HCV Cirrhosis  Enlarging caudate lobe HCC, 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 Laurence Ferrari in clinic 2-4 weeks with CMP with appropriate discharge medications including Cipro 500 mg BID x 7 days  Signed: Hedy Jacob 10/27/2014, 4:21 PM

## 2014-10-27 NOTE — Procedures (Signed)
Interventional Radiology Procedure Note  Procedure: DEB-TACE of primary [facility]  Access: Right CFA, 15F.  Used ExoSeal  Complications: 0  Estimated Blood Loss: 0  Recommendations: - Bedrest x 4 hrs - ADAT after 2 hrs - Hydrate - Sx control - Labs in am  Signed,  Criselda Peaches, MD

## 2014-10-27 NOTE — Plan of Care (Signed)
Problem: Phase I Progression Outcomes Goal: Voiding-avoid urinary catheter unless indicated Outcome: Not Applicable Date Met:  50/27/74 Pt has f/c

## 2014-10-28 ENCOUNTER — Other Ambulatory Visit: Payer: Self-pay | Admitting: Radiology

## 2014-10-28 DIAGNOSIS — C220 Liver cell carcinoma: Secondary | ICD-10-CM

## 2014-10-28 LAB — COMPREHENSIVE METABOLIC PANEL
ALBUMIN: 3.2 g/dL — AB (ref 3.5–5.0)
ALT: 27 U/L (ref 17–63)
ANION GAP: 6 (ref 5–15)
AST: 91 U/L — ABNORMAL HIGH (ref 15–41)
Alkaline Phosphatase: 60 U/L (ref 38–126)
BILIRUBIN TOTAL: 0.5 mg/dL (ref 0.3–1.2)
BUN: 37 mg/dL — ABNORMAL HIGH (ref 6–20)
CO2: 23 mmol/L (ref 22–32)
Calcium: 9.8 mg/dL (ref 8.9–10.3)
Chloride: 109 mmol/L (ref 101–111)
Creatinine, Ser: 1.71 mg/dL — ABNORMAL HIGH (ref 0.61–1.24)
GFR calc Af Amer: 46 mL/min — ABNORMAL LOW (ref >60)
GFR calc non Af Amer: 40 mL/min — ABNORMAL LOW (ref >60)
GLUCOSE: 147 mg/dL — AB (ref 65–99)
POTASSIUM: 4.4 mmol/L (ref 3.5–5.1)
SODIUM: 138 mmol/L (ref 135–145)
TOTAL PROTEIN: 6.8 g/dL (ref 6.5–8.1)

## 2014-10-28 MED ORDER — OXYCODONE HCL 10 MG PO TABS: 5.0000 mg | ORAL_TABLET | Freq: Four times a day (QID) | ORAL | Status: DC | PRN

## 2014-10-28 MED ORDER — CIPROFLOXACIN HCL 500 MG PO TABS: 500.0000 mg | ORAL_TABLET | Freq: Two times a day (BID) | ORAL | Status: DC

## 2014-10-28 NOTE — Discharge Instructions (Addendum)
°  Chemoembolization, Care After Refer to this sheet in the next few weeks. These instructions provide you with information on caring for yourself after your procedure. Your health care provider may also give you more specific instructions. Your treatment has been planned according to current medical practices, but problems sometimes occur. Call your health care provider if you have any problems or questions after your procedure. WHAT TO EXPECT AFTER THE PROCEDURE  After your procedure, it is typical to have the following:  You might have a slight fever for 1-2 weeks after the procedure. If it gets worse, let your health care provider know.  You might feel tired and not hungry. This is normal. These feelings should go away in about 1 week. HOME CARE INSTRUCTIONS  Take any medicine your health care provider prescribed for pain, nausea, or fever. Follow the directions carefully.  Ask your health care provider whether you can take over-the-counter medicines for pain or fever. Do not take aspirin or anti-inflammatory medicines such as ibuprofen or naproxen unless your health care provider says that you should. These medicines can increase the chances of bleeding.  If you were given a small breathing device (incentive spirometer), be sure to use it. It helps keep your lungs clear while you are recovering. You will not need this after your activity level is back to normal.  Do not get the puncture site wet for the first few days after surgery or until your health care provider says it is okay.  You should be able to resume your normal routine in about 1 week.  During the first month after your procedure, you will probably need to go back to your health care provider for some simple tests. Scans and blood tests will help determine whether the procedure worked. SEEK MEDICAL CARE IF:  Blood or fluid leaks from the wound, or the wound becomes red or swollen.  You become nauseous or throw up for more  than 2 days after surgery.  Your pain or fever becomes worse than it was when you left the hospital.  You cannot drink clear liquids such as water or diluted juice or tea 24 hours after your procedure.  You develop a rash. SEEK IMMEDIATE MEDICAL CARE IF:  You have a fever that gets worse or does not go away after 1 week.  You develop pain, swelling, or discoloration in your legs.  Your legs become pale, cold, or blue.  You develop shortness of breath, feel faint, or pass out.  You have chest pain.  You have weakness or difficulty moving your arms or legs.  You have changes in your speech or vision. Document Released: 09/12/2010 Document Revised: 11/04/2012 Document Reviewed: 09/21/2012 [facility] Patient Information 2015 [place], [place]. This information is not intended to replace advice given to you by your health care provider. Make sure you discuss any questions you have with your health care provider.

## 2014-10-28 NOTE — Care Management Note (Signed)
Case Management Note  Patient Details  Name: Antonio Fleming MRN: [id number] Date of Birth: 10-Sep-1950  Subjective/Objective:  64 y/o m admitted w/hepatocellular Ca. From home.                  Action/Plan:d/c home no needs or orders.   Expected Discharge Date:   (unknown)               Expected Discharge Plan:  Home/Self Care  In-House Referral:     Discharge planning Services  CM Consult  Post Acute Care Choice:    Choice offered to:     DME Arranged:    DME Agency:     HH Arranged:    [place] Agency:     Status of Service:  Completed, signed off  Medicare Important Message Given:    Date Medicare IM Given:    Medicare IM give by:    Date Additional Medicare IM Given:    Additional Medicare Important Message give by:     If discussed at [place] of Stay Meetings, dates discussed:    Additional Comments:  Dessa Phi, RN 10/28/2014, 1:20 PM

## 2014-10-28 NOTE — Progress Notes (Signed)
Antonio Fleming to be D/C'd Home per MD order.  Discussed with the patient and all questions fully answered.  VSS, Skin clean, dry and intact without evidence of skin break down, no evidence of skin tears noted. IV catheter discontinued intact. Site without signs and symptoms of complications. Dressing and pressure applied.  An After Visit Summary was printed and given to the patient. Patient received prescription.  D/c education completed with patient/family including follow up instructions, medication list, d/c activities limitations if indicated, with other d/c instructions as indicated by MD - patient able to verbalize understanding, all questions fully answered.   Patient instructed to return to ED, call 911, or call MD for any changes in condition.   Patient escorted via [place], and D/C home via private auto.  [name] D 10/28/2014 12:11 PM

## 2014-10-28 NOTE — Discharge Summary (Signed)
Patient ID: Antonio Fleming MRN: [id number] DOB/AGE: 66-Jun-1952 66 y.o.  Admit date: 10/27/2014 Discharge date: 10/28/2014  Admission Diagnoses:  Principal Problem:   Hepatocellular carcinoma Active Problems:   [place] (hepatocellular carcinoma)  Discharge Diagnoses:  Principal Problem:   Hepatocellular carcinoma Active Problems:   [place] (hepatocellular carcinoma)   Discharged Condition: good  Hospital Course: HPI: Patient with HCV Cirrhosis and an enlarging caudate lobe [place] first diagnosed by MRI 03/2013, not a surgical candidate. He has been seen in full consult on 10/05/2014 and scheduled today for [facility] of [facility] Pt admitted after procedure to floor in stable condition. No overnight issues. Tolerating diet well, no N/V. Pain mild and well controlled. Able to void after foley removed. Labs stable Stable for discharge. All instructions, medications, and follow up plans reviewed.  Consults: None  Treatments: Successful DEB-TACE of caudate lobe [place]  Discharge Exam: Blood pressure 125/61, pulse 43, temperature 97.9 F (36.6 C), temperature source Oral, resp. rate 20, height 5\' 6"  (1.676 m), weight 114 lb 4 oz (51.823 kg), SpO2 100 %. Lungs: CTA without w/r/r Heart: Regular Abdomen:soft, NT Ext: (R)groin soft, NT, no hematoma   Disposition: 01-Home or Self Care  Discharge Instructions    Call MD for:  difficulty breathing, headache or visual disturbances    Complete by:  As directed      Call MD for:  persistant nausea and vomiting    Complete by:  As directed      Call MD for:  redness, tenderness, or signs of infection (pain, swelling, redness, odor or green/yellow discharge around incision site)    Complete by:  As directed      Call MD for:  severe uncontrolled pain    Complete by:  As directed      Call MD for:  temperature >100.4    Complete by:  As directed      Diet - low sodium heart healthy    Complete by:  As directed      Increase activity slowly    Complete  by:  As directed      Remove dressing in 24 hours    Complete by:  As directed             Medication List    TAKE these medications        ciprofloxacin 500 MG tablet  Commonly known as:  CIPRO  Take 1 tablet (500 mg total) by mouth 2 (two) times daily.     fentaNYL 25 MCG/HR patch  Commonly known as:  DURAGESIC - dosed mcg/hr  Place 1 patch (25 mcg total) onto the skin every 3 (three) days.     fludrocortisone 0.1 MG tablet  Commonly known as:  FLORINEF  Take 0.1 mg by mouth daily.     folic acid 1 MG tablet  Commonly known as:  FOLVITE  Take 1 mg by mouth daily.     furosemide 40 MG tablet  Commonly known as:  LASIX  Take 1 tablet (40 mg total) by mouth daily.     oxyCODONE 5 MG immediate release tablet  Commonly known as:  ROXICODONE  Take 1-2 pills, if needed, every 6 hours for pain secondary to liver cancer.     Oxycodone HCl 10 MG Tabs  Take 0.5 tablets (5 mg total) by mouth every 6 (six) hours as needed for moderate pain or severe pain.     pantoprazole 40 MG tablet  Commonly known as:  PROTONIX  Take 40  mg by mouth daily.     propranolol 10 MG tablet  Commonly known as:  INDERAL  Take 1 tablet by mouth 2 (two) times daily.     spironolactone 50 MG tablet  Commonly known as:  ALDACTONE  Take 1 tablet by mouth daily.           Follow-up Information    Follow up with [facility], HEATH, MD. Go in 3 weeks.   Specialties:  Interventional Radiology, Radiology   Why:  Post procedure eval   Contact information:   [address] [place] [address] [phone]        Signed: Ascencion Dike 10/28/2014, 9:19 AM   I have spent Less Than 30 Minutes discharging Antonio Fleming.

## 2014-10-31 ENCOUNTER — Other Ambulatory Visit (HOSPITAL_COMMUNITY): Payer: Self-pay | Admitting: Interventional Radiology

## 2014-10-31 DIAGNOSIS — C220 Liver cell carcinoma: Secondary | ICD-10-CM

## 2014-11-01 NOTE — Patient Outreach (Signed)
[place] [facility]) Care Management  11/01/2014  Antonio Fleming 07/25/1950 [id number]   Referral from [facility] tier 4 list, assigned to Quinn Plowman, RN for patient outreach.  Antonio Fleming, [place] Care Management Assistant

## 2014-11-02 ENCOUNTER — Other Ambulatory Visit: Payer: Self-pay | Admitting: *Deleted

## 2014-11-02 ENCOUNTER — Other Ambulatory Visit (HOSPITAL_COMMUNITY): Payer: Self-pay | Admitting: Interventional Radiology

## 2014-11-02 DIAGNOSIS — C220 Liver cell carcinoma: Secondary | ICD-10-CM

## 2014-11-03 ENCOUNTER — Other Ambulatory Visit: Payer: Self-pay

## 2014-11-03 NOTE — Patient Outreach (Signed)
[place] [facility]) Care Management  11/03/2014  Antonio Fleming [birth year] [id number]   Notification received from Quinn Plowman, RN to close case due to patient refusing services.  Brad Mcgaughy L. [name], [place] Care Management Assistant

## 2014-11-03 NOTE — Patient Outreach (Signed)
[place] [facility]) Care Management  11/03/2014  [place] Bills 11/25/1950 [id number]   SUBJECTIVE:  Telephone call to patient regarding [employer] HMO high risk referral.  HIPAA verified with patient. Discussed and offered [facility] care management services to patient.  Patient refused services at this time.  Patient states he is seeing his oncologist and has followed up with his primary MD.  Patient states he has transportation to and from doctors appointments.  Patient states he is able to afford his medications. Patient states he would like to discuss [facility] care management program with his fiancee.  Patient verbally agreed to received [facility] care management outreach letter and brochure.   PLAN: RNCM will refer patient to [place] to close due to refusal of services.  RNCM will notify patients primary MD of refusal of services.  RNCM will send patient [facility] outreach letter and brochure.   Quinn Plowman RN,BSN,CCM [place] Coordinator [phone]

## 2014-11-08 NOTE — Progress Notes (Signed)
This encounter was created in error - please disregard.

## 2014-11-18 ENCOUNTER — Other Ambulatory Visit: Payer: Self-pay | Admitting: *Deleted

## 2014-11-21 ENCOUNTER — Other Ambulatory Visit: Payer: Self-pay | Admitting: Family

## 2014-11-21 ENCOUNTER — Other Ambulatory Visit: Payer: Self-pay | Admitting: Emergency Medicine

## 2014-11-21 DIAGNOSIS — K717 Toxic liver disease with fibrosis and cirrhosis of liver: Secondary | ICD-10-CM

## 2014-11-21 DIAGNOSIS — C220 Liver cell carcinoma: Secondary | ICD-10-CM

## 2014-11-21 MED ORDER — OXYCODONE HCL 5 MG PO TABS: ORAL_TABLET | ORAL | Status: DC

## 2014-11-21 MED ORDER — PANTOPRAZOLE SODIUM 40 MG PO TBEC: 40.0000 mg | DELAYED_RELEASE_TABLET | Freq: Every day | ORAL | Status: DC

## 2014-11-21 MED ORDER — FENTANYL 25 MCG/HR TD PT72: 25.0000 ug | MEDICATED_PATCH | TRANSDERMAL | Status: DC

## 2014-11-22 ENCOUNTER — Other Ambulatory Visit: Payer: Self-pay | Admitting: Family

## 2014-11-22 DIAGNOSIS — C220 Liver cell carcinoma: Secondary | ICD-10-CM

## 2014-11-22 DIAGNOSIS — K717 Toxic liver disease with fibrosis and cirrhosis of liver: Secondary | ICD-10-CM

## 2014-11-22 LAB — CBC
HCT: 29.2 % — ABNORMAL LOW (ref 39.0–52.0)
Hemoglobin: 10 g/dL — ABNORMAL LOW (ref 13.0–17.0)
MCH: 31.3 pg (ref 26.0–34.0)
MCHC: 34.2 g/dL (ref 30.0–36.0)
MCV: 91.5 fL (ref 78.0–100.0)
MPV: 11.3 fL (ref 8.6–12.4)
PLATELETS: 177 10*3/uL (ref 150–400)
RBC: 3.19 MIL/uL — ABNORMAL LOW (ref 4.22–5.81)
RDW: 14.9 % (ref 11.5–15.5)
WBC: 5.5 10*3/uL (ref 4.0–10.5)

## 2014-11-22 MED ORDER — FENTANYL 25 MCG/HR TD PT72: 25.0000 ug | MEDICATED_PATCH | TRANSDERMAL | Status: DC

## 2014-11-22 MED ORDER — OXYCODONE HCL 5 MG PO TABS: ORAL_TABLET | ORAL | Status: DC

## 2014-11-22 NOTE — Progress Notes (Signed)
Medication refills for Oxicodone and Fentanyl patch did not print yesterday. Was able to print today and gave to patient.

## 2014-11-23 LAB — COMPLETE METABOLIC PANEL WITH GFR
ALT: 9 U/L (ref 9–46)
AST: 17 U/L (ref 10–35)
Albumin: 3.1 g/dL — ABNORMAL LOW (ref 3.6–5.1)
Alkaline Phosphatase: 93 U/L (ref 40–115)
BUN: 26 mg/dL — ABNORMAL HIGH (ref 7–25)
CO2: 25 mmol/L (ref 20–31)
Calcium: 10.7 mg/dL — ABNORMAL HIGH (ref 8.6–10.3)
Chloride: 101 mmol/L (ref 98–110)
Creat: 1.41 mg/dL — ABNORMAL HIGH (ref 0.70–1.25)
GFR, Est African American: 60 mL/min (ref >=60)
GFR, Est Non African American: 52 mL/min — ABNORMAL LOW (ref >=60)
Glucose, Bld: 132 mg/dL — ABNORMAL HIGH (ref 65–99)
Potassium: 4.6 mmol/L (ref 3.5–5.3)
Sodium: 133 mmol/L — ABNORMAL LOW (ref 135–146)
Total Bilirubin: 1 mg/dL (ref 0.2–1.2)
Total Protein: 7.2 g/dL (ref 6.1–8.1)

## 2014-11-23 LAB — PROTIME-INR
INR: 1.22 (ref <1.50)
PROTHROMBIN TIME: 15.6 s — AB (ref 11.6–15.2)

## 2014-11-24 ENCOUNTER — Encounter: Payer: Self-pay | Admitting: *Deleted

## 2014-11-24 NOTE — Progress Notes (Signed)
[place] Work  Clinical Social Work was referred by Development worker, community for assessment of psychosocial needs due to transportation concerns.  Clinical Social Worker attempted to contact patient via phone at home to offer support and assess for needs.  CSW is only aware of ACS, [facility] for transportation assistance. CSW provided pt with number via VM. CSW reviewed chart and noted pt was referred to [facility] and pt refused services. They may have been able to provide additional support for this need.   Clinical Social Work interventions: Resource education Loren Racer, [place] Worker [place]  [place] Phone: [phone] Fax: [phone]

## 2014-11-24 NOTE — Progress Notes (Signed)
This encounter was created in error - please disregard.

## 2014-11-25 ENCOUNTER — Encounter: Payer: Self-pay | Admitting: *Deleted

## 2014-11-25 NOTE — Progress Notes (Signed)
[name] Work  Clinical Social Work called pt back to confirm transportation arranged for his upcoming appointments. Pt reports he has transportation for 11/5 and 11/8. CSW reviewed options to assist with transportation and provided him with ACS number. Pt reports someone will go with him to his appts as he was "scared about them". Pt shared he was worried about bad news. CSW provided supportive listening. Pt reports he has issues affording his medications and CSW discussed asst through [facility] for this and shared need to meet with Antonio Fleming, fin advocate. Pt stated understanding and plans to follow up. He plans to reach out as needed.   Clinical Social Work interventions: Catering manager.  Antonio Fleming, [place] Worker [place]  [place] Phone: [phone] Fax: [phone]

## 2014-12-03 ENCOUNTER — Ambulatory Visit (HOSPITAL_BASED_OUTPATIENT_CLINIC_OR_DEPARTMENT_OTHER)
Admission: RE | Admit: 2014-12-03 | Discharge: 2014-12-03 | Disposition: A | Discharge status: Home / Self Care | Payer: Medicare HMO | Source: Ambulatory Visit | Attending: Interventional Radiology | Admitting: Interventional Radiology

## 2014-12-03 DIAGNOSIS — K8070 Calculus of gallbladder and bile duct without cholecystitis without obstruction: Secondary | ICD-10-CM | POA: Insufficient documentation

## 2014-12-03 DIAGNOSIS — R188 Other ascites: Secondary | ICD-10-CM | POA: Diagnosis not present

## 2014-12-03 DIAGNOSIS — I864 Gastric varices: Secondary | ICD-10-CM | POA: Insufficient documentation

## 2014-12-03 DIAGNOSIS — K7030 Alcoholic cirrhosis of liver without ascites: Secondary | ICD-10-CM | POA: Diagnosis not present

## 2014-12-03 DIAGNOSIS — C220 Liver cell carcinoma: Secondary | ICD-10-CM | POA: Diagnosis present

## 2014-12-03 DIAGNOSIS — B1920 Unspecified viral hepatitis C without hepatic coma: Secondary | ICD-10-CM | POA: Diagnosis not present

## 2014-12-03 DIAGNOSIS — I8500 Esophageal varices without bleeding: Secondary | ICD-10-CM | POA: Diagnosis not present

## 2014-12-03 DIAGNOSIS — R161 Splenomegaly, not elsewhere classified: Secondary | ICD-10-CM | POA: Diagnosis not present

## 2014-12-03 DIAGNOSIS — K838 Other specified diseases of biliary tract: Secondary | ICD-10-CM | POA: Insufficient documentation

## 2014-12-03 MED ORDER — GADOBENATE DIMEGLUMINE 529 MG/ML IV SOLN: 10.0000 mL | Freq: Once | INTRAVENOUS | Status: DC | PRN

## 2014-12-05 ENCOUNTER — Encounter: Payer: Self-pay | Admitting: Nutrition

## 2014-12-05 NOTE — Progress Notes (Signed)
Received request from SW to provide patient with Ensure Plus. Patient diagnosed with Hepatocellular carcinoma and is a patient of Dr. Marin Olp. Patient underweight with BMI of 18.45. Provided one case of Ensure Plus for patient to pick up to Tuesday, Nov 8.

## 2014-12-06 ENCOUNTER — Ambulatory Visit
Admission: RE | Admit: 2014-12-06 | Discharge: 2014-12-06 | Disposition: A | Discharge status: Home / Self Care | Payer: Commercial Managed Care - HMO | Source: Ambulatory Visit | Attending: Interventional Radiology | Admitting: Interventional Radiology

## 2014-12-06 DIAGNOSIS — C220 Liver cell carcinoma: Secondary | ICD-10-CM

## 2014-12-06 NOTE — Progress Notes (Signed)
Chief Complaint: Patient was seen in follow up today for  Chief Complaint  Patient presents with  . Follow-up    6 wk follow up DEB-TACE     at the request of Burney Gauze  History of Present Illness: Antonio Fleming is a 64 y.o. 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 He has not gone prior antiretroviral therapy for his hepatitis C. He has had several paracenteses for his ascites. Antonio Fleming alpha-fetoprotein is normal at 1.7. He falls into the approximately 30% of hepatocellular carcinomas which are non-secretor's of AFP.  He underwent chemoembolization with drug-eluting beads on 10/27/2014 and presents today for his initial follow-up evaluation.  His postoperative course was relatively uneventful. He did experience fairly standard post embolization syndrome symptoms like a low-grade flu. Additionally, he continues to have some fatigue, decreased appetite and thinning of his hair which may be systemic side effects of his intrahepatic doxorubicin dose.  Additionally, he continues to have intermittent right upper quadrant pain which is irritating but not significant. He remains in good spirits and denies chest pain, shortness of breath or other active symptoms.  He is extremely pleased to hear about the good results of his chemoembolization procedure.   Past Medical History  Diagnosis Date  . Cirrhosis   . Renal disorder   . GI bleed   . Anxiety   . Arthritis   . Blood transfusion without reported diagnosis   . Cancer   . Cataract   . Depression   . GERD (gastroesophageal reflux disease)   . Hypertension   . Substance abuse   . Tuberculosis     Past Surgical History  Procedure Laterality Date  . Abdominal surgery      band around gut at [facility]  . Throat surgery    . Eye surgery  2013      Bilateral cataract  surgery      Allergies: Penicillins; Tylenol; and Codeine  Medications: Prior to Admission medications   Medication Sig Start Date End Date Taking? Authorizing Provider  fentaNYL (DURAGESIC - DOSED MCG/HR) 25 MCG/HR patch Place 1 patch (25 mcg total) onto the skin every 3 (three) days. 11/22/14  Yes Eliezer Bottom, NP  fludrocortisone (FLORINEF) 0.1 MG tablet Take 0.1 mg by mouth daily.   Yes Historical Provider, MD  folic acid (FOLVITE) 1 MG tablet Take 1 mg by mouth daily.   Yes Historical Provider, MD  furosemide (LASIX) 40 MG tablet Take 1 tablet (40 mg total) by mouth daily. 09/09/14  Yes Volanda Napoleon, MD  oxyCODONE (ROXICODONE) 5 MG immediate release tablet Take 1-2 pills, if needed, every 6 hours for pain secondary to liver cancer. 11/22/14  Yes Eliezer Bottom, NP  oxyCODONE 10 MG TABS Take 0.5 tablets (5 mg total) by mouth every 6 (six) hours as needed for moderate pain or severe pain. 10/28/14  Yes Ascencion Dike, PA-C  pantoprazole (PROTONIX) 40 MG tablet Take 1 tablet (40 mg total) by mouth daily. 11/21/14  Yes Eliezer Bottom, NP  propranolol (INDERAL) 10 MG tablet Take 1 tablet by mouth 2 (two) times daily. 12/17/12  Yes Historical Provider, MD  spironolactone (ALDACTONE) 50 MG tablet Take 1 tablet by mouth daily.  12/16/12  Yes Historical Provider, MD  ciprofloxacin (CIPRO) 500 MG tablet Take 1 tablet (500 mg total) by mouth 2 (two) times daily. Patient  not taking: Reported on 12/06/2014 10/28/14   Ascencion Dike, PA-C     Family History  Problem Relation Age of Onset  . Heart disease Mother   . Cancer Father     Social History   Social History  . Marital Status: Divorced    Spouse Name: N/A  . Number of Children: N/A  . Years of Education: N/A   Social History Main Topics  . Smoking status: Current Every Day Smoker -- 1.00 packs/day for 49 years    Types: Cigarettes    Start date: 03/20/1973  . Smokeless tobacco: Never Used     Comment: 08-18-14  still  smoking  . Alcohol Use: 0.0 oz/week    0 Standard drinks or equivalent per week  . Drug Use: No  . Sexual Activity: Not on file   Other Topics Concern  . Not on file   Social History Narrative    ECOG Status: 2 - Symptomatic, <50% confined to bed  Review of Systems: A 12 point ROS discussed and pertinent positives are indicated in the HPI above.  All other systems are negative.  Review of Systems  Vital Signs: BP 106/55 mmHg  Pulse 51  Temp(Src) 97.7 F (36.5 C) (Oral)  Resp 14  Ht 5\' 5"  (1.651 m)  Wt 114 lb (51.71 kg)  BMI 18.97 kg/m2  SpO2 100%  Physical Exam  Constitutional: He is oriented to person, place, and time. Vital signs are normal. He appears well-developed. No distress.  Thin, frail-appearing  HENT:  Head: Normocephalic and atraumatic. Hair is abnormal.  Eyes: No scleral icterus.  Cardiovascular: Normal rate.   Pulmonary/Chest: Effort normal.  Abdominal: Soft. He exhibits no distension. There is no tenderness.  Neurological: He is alert and oriented to person, place, and time.  Skin: Skin is warm and dry.  Psychiatric: He has a normal mood and affect. His behavior is normal.    Mallampati Score:     Imaging: Mr Abdomen W Wo Contrast  12/03/2014  CLINICAL DATA:  HCV and alcoholic cirrhosis complicated by caudate lobe hepatocellular carcinoma, presenting for follow-up status post DEB-TACE on 10/27/2014. EXAM: MRI ABDOMEN WITHOUT AND WITH CONTRAST TECHNIQUE: Multiplanar multisequence MR imaging of the abdomen was performed both before and after the administration of intravenous contrast. CONTRAST:  10 cc MultiHance IV. COMPARISON:  08/30/2014 CT abdomen/ pelvis. FINDINGS: There is prominent susceptibility artifact surrounding the gastric cardia related to metallic clip as seen on prior CT, which partially limits visualization of the left liver. Lower chest: Clear lung bases. Hepatobiliary: Diffusely lobular liver contour with asymmetric hypertrophy of the  left liver lobe and patchy confluent hepatic fibrosis predominantly in the medial segment left liver lobe, in keeping with cirrhosis. There is a 5.2 x 4.6 cm liver mass centered within the caudate lobe (which partially extends into segment 7 of the right liver lobe), which is unchanged in size since 08/30/2014 and which demonstrates nearly uniform precontrast T1 hyperintensity and a few small patchy peripheral areas of enhancement predominantly in the anterior peripheral portions of the mass, in keeping with hemorrhagic necrosis of most of the tumor with approximately 10% residual tumor viability at the periphery. No new liver mass. Re- demonstrated are multiple subcentimeter gallstones with a small amount of layering sludge within the nondistended gallbladder, with no gallbladder wall thickening or pericholecystic fluid. Minimal central intrahepatic biliary ductal dilatation, unchanged. Dilated common bile duct (9 mm diameter), not appreciably changed. There are adjacent 9 mm and 6 mm choledocholithiasis in the  distal third of the common bile duct. Pancreas: No pancreatic mass or duct dilation.  No pancreas divisum. Spleen: Mild-to-moderate splenomegaly (craniocaudal splenic length 14.7 cm), not appreciably changed. No splenic mass. Adrenals/Urinary Tract: Normal adrenals. No hydronephrosis. Simple 3.2 cm posterior upper right renal cyst. Bosniak category 2 minimally complex 1.0 cm anterior mid to upper right renal cyst with thin internal septation. Additional simple subcentimeter renal cysts in both kidneys. Stomach/Bowel: Grossly normal visualized stomach, noting nonvisualization of the proximal stomach due to susceptibility artifact. Visualized small and large bowel is normal caliber, with no bowel wall thickening. Vascular/Lymphatic: Normal caliber abdominal aorta. Stable paraumbilical and gastroesophageal varices. Patent portal, splenic, hepatic and renal veins. No pathologically enlarged lymph nodes in the  abdomen. Other: Small to moderate volume abdominal ascites, not appreciably changed. Musculoskeletal: No aggressive appearing focal osseous lesions. IMPRESSION: 1. Significant treatment effect on the caudate lobe hepatocellular carcinoma, which is stable in size with hemorrhagic necrosis of most of the tumor, with minimal (approximately 10%) residual tumor viability at the periphery. 2. No new liver masses. 3. Cirrhosis. Stable mild to moderate splenomegaly. Stable small to moderate volume abdominal ascites. Stable paraumbilical and gastroesophageal varices. 4. Cholelithiasis and choledocholithiasis with stable dilated common bile duct. Correlate with serum bilirubin levels. Electronically Signed   By: Ilona Sorrel M.D.   On: 12/03/2014 15:47    Labs:  CBC:  Recent Labs  08/18/14 1506 09/09/14 1317 10/27/14 0950 11/22/14 0001  WBC 3.8* 4.2 5.1 5.5  HGB 9.1* 8.6* 10.2* 10.0*  HCT 27.2* 25.6* 30.2* 29.2*  PLT 76* 76* 86* 177    COAGS:  Recent Labs  10/27/14 0950 11/22/14 0001  INR 1.16 1.22  APTT 34  --     BMP:  Recent Labs  09/09/14 1318 10/27/14 0950 10/28/14 0545 11/22/14 0001  NA 139 139 138 133*  K 5.4* 3.9 4.4 4.6  CL 109* 105 109 101  CO2 19 25 23 25   GLUCOSE 129* 115* 147* 132*  BUN 17 37* 37* 26*  CALCIUM 10.7* 10.8* 9.8 10.7*  CREATININE 1.1 1.68* 1.71* 1.41*  GFRNONAA  --  41* 40* 52*  GFRAA  --  47* 46* 60    LIVER FUNCTION TESTS:  Recent Labs  09/09/14 1318 10/27/14 0950 10/28/14 0545 11/22/14 0001  BILITOT 1.00 1.2 0.5 1.0  AST 33 35 91* 17  ALT 20 21 27 9   ALKPHOS 74 69 60 93  PROT 7.1 8.4* 6.8 7.2  ALBUMIN 3.1* 3.9 3.2* 3.1*    TUMOR MARKERS:  Recent Labs  08/18/14 1506  AFPTM 1.7    Assessment and Plan:  Excellent response to initial round of chemoembolization with drug-eluting beads. His tumor is at least 90% necrotic. By my measurement, I actually consider it to be closer to 95% necrotic. This is an exceptional outcome for  such a large tumor following a single session of chemoembolization.  Antonio Fleming has experienced some systemic symptoms from his intrahepatic doxorubicin dose including persistent fatigue, decreased appetite and hair loss. His liver function remains good. He has sustained no clinically significant decrease in his hepatic reserve. It appears he has some borderline renal insufficiency.  Of his intra-arterial chemotherapy should be self-limited. I expect his fatigue to decrease in his appetite to return over the coming days and weeks. Similarly, his hair thinning should be self-limited. I recommend no additional intervention at this time. We will continue to surveillance his liver for evidence of residual, recurrent or new disease.  1.) Return to  clinic in 3 months with repeat liver MRI and liver labs. 2.) I issued a prescription for Ultram 50 mg by mouth every 4 hours as needed for pain for his persistent right upper quadrant pain.  SignedJacqulynn Cadet 12/06/2014, 4:42 PM   I spent a total of  15 Minutes in face to face in clinical consultation, greater than 50% of which was counseling/coordinating care for [facility].

## 2014-12-07 ENCOUNTER — Telehealth: Payer: Self-pay

## 2014-12-07 NOTE — Telephone Encounter (Signed)
Left message to call to schedule AWV

## 2014-12-08 ENCOUNTER — Encounter: Payer: Self-pay | Admitting: *Deleted

## 2014-12-08 NOTE — Progress Notes (Signed)
[place] Work  Clinical Social Work continues to try to assist with transportation, but pt has no more appointments in [employer]. [employer], CSW calling to ask for assist with ride on 01/26/15, but unable to tell where appointment is. Pt very hard to understand over the phone. This CSW suggested option of [employer] transportation to [place] and she will follow up. [facility] is an additional option, if appt is cancer related.  Clinical Social Work interventions: Resource assistance Pt advocacy  Loren Racer, [place] Worker [place]  [place] Phone: [phone] Fax: [phone]

## 2014-12-14 ENCOUNTER — Other Ambulatory Visit: Payer: Self-pay | Admitting: *Deleted

## 2014-12-14 DIAGNOSIS — K717 Toxic liver disease with fibrosis and cirrhosis of liver: Secondary | ICD-10-CM

## 2014-12-14 DIAGNOSIS — C220 Liver cell carcinoma: Secondary | ICD-10-CM

## 2014-12-14 MED ORDER — FOLIC ACID 1 MG PO TABS: 1.0000 mg | ORAL_TABLET | Freq: Every day | ORAL | Status: DC

## 2014-12-14 MED ORDER — PANTOPRAZOLE SODIUM 40 MG PO TBEC: 40.0000 mg | DELAYED_RELEASE_TABLET | Freq: Every day | ORAL | Status: DC

## 2014-12-26 ENCOUNTER — Other Ambulatory Visit: Payer: Self-pay | Admitting: *Deleted

## 2014-12-26 DIAGNOSIS — C220 Liver cell carcinoma: Secondary | ICD-10-CM

## 2014-12-26 DIAGNOSIS — K717 Toxic liver disease with fibrosis and cirrhosis of liver: Secondary | ICD-10-CM

## 2014-12-26 MED ORDER — OXYCODONE HCL 10 MG PO TABS: 5.0000 mg | ORAL_TABLET | Freq: Four times a day (QID) | ORAL | Status: DC | PRN

## 2014-12-26 MED ORDER — FENTANYL 25 MCG/HR TD PT72: 25.0000 ug | MEDICATED_PATCH | TRANSDERMAL | Status: DC

## 2014-12-26 MED ORDER — OXYCODONE HCL 5 MG PO TABS: ORAL_TABLET | ORAL | Status: DC

## 2014-12-27 ENCOUNTER — Telehealth: Payer: Self-pay | Admitting: Radiology

## 2014-12-27 NOTE — Telephone Encounter (Signed)
Left message on H# re:  Patient request for additional pain meds.  Per Dr Laurence Ferrari:  For chronic pain, patient would need to check with primary care MD or pain management clinic.  Tilda Samudio Riki Rusk, RN 12/27/2014 9:53 AM

## 2015-01-13 ENCOUNTER — Other Ambulatory Visit: Payer: Self-pay | Admitting: *Deleted

## 2015-01-13 MED ORDER — SPIRONOLACTONE 50 MG PO TABS: 50.0000 mg | ORAL_TABLET | Freq: Every day | ORAL | Status: DC

## 2015-02-01 ENCOUNTER — Other Ambulatory Visit: Payer: Self-pay | Admitting: *Deleted

## 2015-02-01 DIAGNOSIS — K717 Toxic liver disease with fibrosis and cirrhosis of liver: Secondary | ICD-10-CM

## 2015-02-01 DIAGNOSIS — C220 Liver cell carcinoma: Secondary | ICD-10-CM

## 2015-02-01 MED ORDER — FENTANYL 25 MCG/HR TD PT72: 25.0000 ug | MEDICATED_PATCH | TRANSDERMAL | Status: DC

## 2015-02-01 MED ORDER — OXYCODONE HCL 10 MG PO TABS: 5.0000 mg | ORAL_TABLET | Freq: Four times a day (QID) | ORAL | Status: DC | PRN

## 2015-02-01 MED ORDER — OXYCODONE HCL 5 MG PO TABS: ORAL_TABLET | ORAL | Status: DC

## 2015-02-02 MED FILL — oxyCODONE HCL 5 MG TABS: 5 | 15 days supply | Qty: 120 | Fill #0

## 2015-02-02 MED FILL — fentaNYL 25 MCG/HR PT72: 25 | 30 days supply | Qty: 10 | Fill #0

## 2015-02-02 MED FILL — oxyCODONE HCL 10 MG TABS: 10 | 15 days supply | Qty: 30 | Fill #0

## 2015-03-06 ENCOUNTER — Other Ambulatory Visit: Payer: Self-pay | Admitting: *Deleted

## 2015-03-06 ENCOUNTER — Other Ambulatory Visit (HOSPITAL_COMMUNITY): Payer: Self-pay | Admitting: Interventional Radiology

## 2015-03-06 ENCOUNTER — Ambulatory Visit: Payer: Commercial Managed Care - HMO | Admitting: Medical

## 2015-03-06 DIAGNOSIS — B1710 Acute hepatitis C without hepatic coma: Secondary | ICD-10-CM

## 2015-03-06 DIAGNOSIS — C220 Liver cell carcinoma: Secondary | ICD-10-CM

## 2015-03-06 DIAGNOSIS — K717 Toxic liver disease with fibrosis and cirrhosis of liver: Secondary | ICD-10-CM

## 2015-03-06 MED ORDER — OXYCODONE HCL 5 MG PO TABS: ORAL_TABLET | ORAL | Status: DC

## 2015-03-06 MED ORDER — OXYCODONE HCL 10 MG PO TABS: 5.0000 mg | ORAL_TABLET | Freq: Four times a day (QID) | ORAL | Status: DC | PRN

## 2015-03-08 ENCOUNTER — Other Ambulatory Visit: Payer: Medicare HMO

## 2015-03-08 ENCOUNTER — Ambulatory Visit (INDEPENDENT_AMBULATORY_CARE_PROVIDER_SITE_OTHER): Payer: Medicare HMO | Admitting: Medical

## 2015-03-08 ENCOUNTER — Ambulatory Visit: Payer: Commercial Managed Care - HMO | Admitting: Family

## 2015-03-08 ENCOUNTER — Encounter: Payer: Self-pay | Admitting: Medical

## 2015-03-08 VITALS — BP 108/63 | HR 60 | Temp 97.9°F | Ht 65.0 in | Wt 124.4 lb

## 2015-03-08 DIAGNOSIS — Z23 Encounter for immunization: Secondary | ICD-10-CM | POA: Diagnosis not present

## 2015-03-08 DIAGNOSIS — C220 Liver cell carcinoma: Secondary | ICD-10-CM | POA: Diagnosis not present

## 2015-03-08 DIAGNOSIS — R944 Abnormal results of kidney function studies: Secondary | ICD-10-CM

## 2015-03-08 DIAGNOSIS — D649 Anemia, unspecified: Secondary | ICD-10-CM | POA: Diagnosis not present

## 2015-03-08 DIAGNOSIS — G894 Chronic pain syndrome: Secondary | ICD-10-CM

## 2015-03-08 DIAGNOSIS — K717 Toxic liver disease with fibrosis and cirrhosis of liver: Secondary | ICD-10-CM

## 2015-03-08 DIAGNOSIS — R5383 Other fatigue: Secondary | ICD-10-CM | POA: Diagnosis not present

## 2015-03-08 MED ORDER — PROPRANOLOL HCL 10 MG PO TABS: 10.0000 mg | ORAL_TABLET | Freq: Two times a day (BID) | ORAL | Status: AC

## 2015-03-08 NOTE — Progress Notes (Signed)
   Subjective:    Patient ID: Antonio Fleming, male    DOB: [birth year], 65 y.o.   MRN: [id number]  HPI   I have reviewed pt PMH, PSH, FH, Social History and Surgical History.  Pt in for new visit. Pt describes chronic fatigue, decreased appetite and not sleeping well.  Pt has history of liver cancer. He had chemotherapy in September. End of.  On review of labs pt had low na in past. Increased cr and decreased gfr. Pt also has know anemia.  In addition has portal hypertension and hep C history. History of Gi bleed as well. But not recently  Pt still drinks alcohol. Occasionally. Last drank on Sunday. 4-5 beers.  Pt has pain secondary to liver cancer. Pt had been getting pain medications from Dr. Marin Olp.   Review of Systems  Constitutional: Positive for appetite change and fatigue. Negative for fever and chills.       Today ros reflects how he feels chronically. Pt had diagnosis of liver cancer for 4 years. Cirrhosis of liver for 10 years.  Respiratory: Negative for cough, chest tightness and wheezing.   Cardiovascular: Negative for chest pain and palpitations.  Gastrointestinal: Negative for nausea, vomiting, abdominal pain, constipation, blood in stool, anal bleeding and rectal pain.  Musculoskeletal: Negative for back pain.  Hematological: Negative for adenopathy. Does not bruise/bleed easily.  Psychiatric/Behavioral: Positive for sleep disturbance. Negative for behavioral problems and confusion.        Objective:   Physical Exam  General Appearance- Not in acute distress.  HEENT Eyes- Scleraeral/Conjuntiva-bilat- Not Yellow. Mouth & Throat- Normal.  Chest and Lung Exam Auscultation: Breath sounds:-Normal. Adventitious sounds:- No Adventitious sounds.  Cardiovascular Auscultation:Rythm - Regular. Heart Sounds -Normal heart sounds.  Abdomen Inspection:-Inspection Normal.  Palpation/Perucssion: Palpation and Percussion of the abdomen reveal- Non Tender, No Rebound  tenderness, No rigidity(Guarding) and No Palpable abdominal masses.  Liver:-Normal.  Spleen:- Normal.       Assessment & Plan:  For your liver cancer and other chronic conditions continue current medications.  I do want to do labs and check your sodium level, kidney function and your anemia level.  Continue pain meds that Dr. Marin Olp has prescribed. It appears they have printed out rx for your to pick up today.  Follow up with me in 3 wks or as needed  Regarding problems sleeping. He does have controlled meds on his med list. May address this with him in future once I get to know him. Might try low dose  Of med such as trazadone.

## 2015-03-08 NOTE — Patient Instructions (Signed)
For your liver cancer and other chronic conditions continue current medications.  I do want to do labs and check your sodium level, kidney function and your anemia level.  Continue pain meds that Dr. Marin Olp has prescribed. It appears they have printed out rx for your to pick up today.  Follow up with me in 3 wks or as needed

## 2015-03-08 NOTE — Progress Notes (Signed)
Pre visit review using our clinic review tool, if applicable. No additional management support is needed unless otherwise documented below in the visit note. 

## 2015-03-09 LAB — COMPREHENSIVE METABOLIC PANEL
ALT: 14 U/L (ref 0–53)
AST: 23 U/L (ref 0–37)
Albumin: 3.4 g/dL — ABNORMAL LOW (ref 3.5–5.2)
Alkaline Phosphatase: 87 U/L (ref 39–117)
BUN: 22 mg/dL (ref 6–23)
CHLORIDE: 107 meq/L (ref 96–112)
CO2: 25 meq/L (ref 19–32)
CREATININE: 1.19 mg/dL (ref 0.40–1.50)
Calcium: 10.7 mg/dL — ABNORMAL HIGH (ref 8.4–10.5)
GFR: 64.92 mL/min (ref >60.00)
GLUCOSE: 94 mg/dL (ref 70–99)
POTASSIUM: 4.5 meq/L (ref 3.5–5.1)
SODIUM: 136 meq/L (ref 135–145)
Total Bilirubin: 1.1 mg/dL (ref 0.2–1.2)
Total Protein: 7.4 g/dL (ref 6.0–8.3)

## 2015-03-09 LAB — CBC WITH DIFFERENTIAL/PLATELET
BASOS PCT: 0.2 % (ref 0.0–3.0)
Basophils Absolute: 0 10*3/uL (ref 0.0–0.1)
EOS ABS: 0.3 10*3/uL (ref 0.0–0.7)
EOS PCT: 7 % — AB (ref 0.0–5.0)
HCT: 31.5 % — ABNORMAL LOW (ref 39.0–52.0)
Hemoglobin: 10.6 g/dL — ABNORMAL LOW (ref 13.0–17.0)
LYMPHS ABS: 0.6 10*3/uL — AB (ref 0.7–4.0)
Lymphocytes Relative: 12.2 % (ref 12.0–46.0)
MCHC: 33.8 g/dL (ref 30.0–36.0)
MCV: 96.7 fl (ref 78.0–100.0)
MONO ABS: 0.3 10*3/uL (ref 0.1–1.0)
Monocytes Relative: 6.4 % (ref 3.0–12.0)
NEUTROS PCT: 74.2 % (ref 43.0–77.0)
Neutro Abs: 3.5 10*3/uL (ref 1.4–7.7)
PLATELETS: 124 10*3/uL — AB (ref 150.0–400.0)
RBC: 3.26 Mil/uL — ABNORMAL LOW (ref 4.22–5.81)
RDW: 14.6 % (ref 11.5–15.5)
WBC: 4.7 10*3/uL (ref 4.0–10.5)

## 2015-03-09 LAB — GAMMA GT: GGT: 66 U/L — AB (ref 7–51)

## 2015-03-13 ENCOUNTER — Encounter (HOSPITAL_BASED_OUTPATIENT_CLINIC_OR_DEPARTMENT_OTHER): Payer: Self-pay | Admitting: *Deleted

## 2015-03-13 ENCOUNTER — Ambulatory Visit: Payer: Medicare HMO | Admitting: Family

## 2015-03-13 ENCOUNTER — Emergency Department (HOSPITAL_BASED_OUTPATIENT_CLINIC_OR_DEPARTMENT_OTHER): Payer: Medicare HMO

## 2015-03-13 ENCOUNTER — Other Ambulatory Visit: Payer: Medicare HMO

## 2015-03-13 ENCOUNTER — Emergency Department (HOSPITAL_BASED_OUTPATIENT_CLINIC_OR_DEPARTMENT_OTHER)
Admission: EM | Admit: 2015-03-13 | Discharge: 2015-03-13 | Disposition: A | Discharge status: Home / Self Care | Payer: Medicare HMO | Attending: Emergency Medicine | Admitting: Emergency Medicine

## 2015-03-13 DIAGNOSIS — Z8619 Personal history of other infectious and parasitic diseases: Secondary | ICD-10-CM | POA: Insufficient documentation

## 2015-03-13 DIAGNOSIS — R5383 Other fatigue: Secondary | ICD-10-CM | POA: Diagnosis not present

## 2015-03-13 DIAGNOSIS — I10 Essential (primary) hypertension: Secondary | ICD-10-CM | POA: Insufficient documentation

## 2015-03-13 DIAGNOSIS — Z79899 Other long term (current) drug therapy: Secondary | ICD-10-CM | POA: Diagnosis not present

## 2015-03-13 DIAGNOSIS — Z87448 Personal history of other diseases of urinary system: Secondary | ICD-10-CM | POA: Diagnosis not present

## 2015-03-13 DIAGNOSIS — C220 Liver cell carcinoma: Secondary | ICD-10-CM

## 2015-03-13 DIAGNOSIS — R69 Illness, unspecified: Secondary | ICD-10-CM

## 2015-03-13 DIAGNOSIS — Z7952 Long term (current) use of systemic steroids: Secondary | ICD-10-CM | POA: Insufficient documentation

## 2015-03-13 DIAGNOSIS — Z859 Personal history of malignant neoplasm, unspecified: Secondary | ICD-10-CM | POA: Diagnosis not present

## 2015-03-13 DIAGNOSIS — K717 Toxic liver disease with fibrosis and cirrhosis of liver: Secondary | ICD-10-CM

## 2015-03-13 DIAGNOSIS — K219 Gastro-esophageal reflux disease without esophagitis: Secondary | ICD-10-CM | POA: Diagnosis not present

## 2015-03-13 DIAGNOSIS — R109 Unspecified abdominal pain: Secondary | ICD-10-CM | POA: Insufficient documentation

## 2015-03-13 DIAGNOSIS — M1990 Unspecified osteoarthritis, unspecified site: Secondary | ICD-10-CM | POA: Insufficient documentation

## 2015-03-13 DIAGNOSIS — F17210 Nicotine dependence, cigarettes, uncomplicated: Secondary | ICD-10-CM | POA: Diagnosis not present

## 2015-03-13 DIAGNOSIS — Z9842 Cataract extraction status, left eye: Secondary | ICD-10-CM | POA: Insufficient documentation

## 2015-03-13 DIAGNOSIS — Z880 Allergy status to penicillin: Secondary | ICD-10-CM | POA: Diagnosis not present

## 2015-03-13 DIAGNOSIS — Z9841 Cataract extraction status, right eye: Secondary | ICD-10-CM | POA: Diagnosis not present

## 2015-03-13 DIAGNOSIS — F419 Anxiety disorder, unspecified: Secondary | ICD-10-CM | POA: Diagnosis not present

## 2015-03-13 DIAGNOSIS — J111 Influenza due to unidentified influenza virus with other respiratory manifestations: Secondary | ICD-10-CM | POA: Insufficient documentation

## 2015-03-13 DIAGNOSIS — R52 Pain, unspecified: Secondary | ICD-10-CM | POA: Diagnosis present

## 2015-03-13 LAB — COMPREHENSIVE METABOLIC PANEL
ALT: 20 U/L (ref 17–63)
ANION GAP: 12 (ref 5–15)
AST: 36 U/L (ref 15–41)
Albumin: 3.3 g/dL — ABNORMAL LOW (ref 3.5–5.0)
Alkaline Phosphatase: 92 U/L (ref 38–126)
BUN: 25 mg/dL — ABNORMAL HIGH (ref 6–20)
CHLORIDE: 109 mmol/L (ref 101–111)
CO2: 20 mmol/L — ABNORMAL LOW (ref 22–32)
CREATININE: 1.31 mg/dL — AB (ref 0.61–1.24)
Calcium: 10.5 mg/dL — ABNORMAL HIGH (ref 8.9–10.3)
GFR, EST NON AFRICAN AMERICAN: 55 mL/min — AB (ref >60)
Glucose, Bld: 211 mg/dL — ABNORMAL HIGH (ref 65–99)
POTASSIUM: 3.7 mmol/L (ref 3.5–5.1)
Sodium: 141 mmol/L (ref 135–145)
Total Bilirubin: 1.5 mg/dL — ABNORMAL HIGH (ref 0.3–1.2)
Total Protein: 7.5 g/dL (ref 6.5–8.1)

## 2015-03-13 LAB — LIPASE, BLOOD: Lipase: 43 U/L (ref 11–51)

## 2015-03-13 LAB — URINALYSIS, ROUTINE W REFLEX MICROSCOPIC
BILIRUBIN URINE: NEGATIVE
Glucose, UA: NEGATIVE mg/dL
HGB URINE DIPSTICK: NEGATIVE
Ketones, ur: 15 mg/dL — AB
NITRITE: NEGATIVE
PROTEIN: NEGATIVE mg/dL
Specific Gravity, Urine: 1.014 (ref 1.005–1.030)
pH: 7 (ref 5.0–8.0)

## 2015-03-13 LAB — CBC WITH DIFFERENTIAL/PLATELET
Basophils Absolute: 0 10*3/uL (ref 0.0–0.1)
Basophils Relative: 0 %
EOS ABS: 0 10*3/uL (ref 0.0–0.7)
Eosinophils Relative: 1 %
HCT: 32.5 % — ABNORMAL LOW (ref 39.0–52.0)
Hemoglobin: 10.9 g/dL — ABNORMAL LOW (ref 13.0–17.0)
Lymphocytes Relative: 6 %
Lymphs Abs: 0.2 10*3/uL — ABNORMAL LOW (ref 0.7–4.0)
MCH: 31.3 pg (ref 26.0–34.0)
MCHC: 33.5 g/dL (ref 30.0–36.0)
MCV: 93.4 fL (ref 78.0–100.0)
MONO ABS: 0.3 10*3/uL (ref 0.1–1.0)
MYELOCYTES: 1 %
Metamyelocytes Relative: 1 %
Monocytes Relative: 8 %
NEUTROS ABS: 3.2 10*3/uL (ref 1.7–7.7)
NEUTROS PCT: 83 %
PLATELETS: 90 10*3/uL — AB (ref 150–400)
RBC: 3.48 MIL/uL — AB (ref 4.22–5.81)
RDW: 14.3 % (ref 11.5–15.5)
WBC: 3.7 10*3/uL — AB (ref 4.0–10.5)

## 2015-03-13 LAB — URINE MICROSCOPIC-ADD ON: SQUAMOUS EPITHELIAL / LPF: NONE SEEN

## 2015-03-13 MED ORDER — ONDANSETRON HCL 4 MG/2ML IJ SOLN
4.0000 mg | Freq: Once | INTRAMUSCULAR | Status: AC
  Administered 2015-03-13: 4 mg via INTRAVENOUS
  Filled 2015-03-13: qty 2

## 2015-03-13 MED ORDER — IBUPROFEN 800 MG PO TABS
800.0000 mg | ORAL_TABLET | Freq: Once | ORAL | Status: AC
  Administered 2015-03-13: 800 mg via ORAL
  Filled 2015-03-13: qty 1

## 2015-03-13 MED ORDER — SODIUM CHLORIDE 0.9 % IV BOLUS (SEPSIS)
1000.0000 mL | Freq: Once | INTRAVENOUS | Status: AC
  Administered 2015-03-13: 1000 mL via INTRAVENOUS

## 2015-03-13 MED ORDER — IOHEXOL 300 MG/ML  SOLN
100.0000 mL | Freq: Once | INTRAMUSCULAR | Status: AC | PRN
  Administered 2015-03-13: 100 mL via INTRAVENOUS

## 2015-03-13 MED ORDER — MORPHINE SULFATE (PF) 4 MG/ML IV SOLN
4.0000 mg | Freq: Once | INTRAVENOUS | Status: AC
  Administered 2015-03-13: 4 mg via INTRAVENOUS
  Filled 2015-03-13: qty 1

## 2015-03-13 MED ORDER — OXYCODONE HCL 5 MG PO TABS: ORAL_TABLET | ORAL | Status: DC

## 2015-03-13 MED FILL — oxyCODONE HCL 5 MG TABS: 5 | 2 days supply | Qty: 10 | Fill #0

## 2015-03-13 NOTE — ED Notes (Signed)
Patient transported to CT AND XR

## 2015-03-13 NOTE — ED Provider Notes (Signed)
CSN: [id number]     Arrival date & time 03/13/15  1305 History   First MD Initiated Contact with Patient 03/13/15 1316     Chief Complaint  Patient presents with  . Generalized Body Aches     (Consider location/radiation/quality/duration/timing/severity/associated sxs/prior Treatment) Patient is a 65 y.o. male presenting with general illness. The history is provided by the patient.  Illness Severity:  Moderate Onset quality:  Sudden Duration:  2 days Timing:  Constant Progression:  Worsening Chronicity:  New Associated symptoms: abdominal pain, congestion, cough, fatigue and fever   Associated symptoms: no chest pain, no diarrhea, no headaches, no myalgias, no rash, no shortness of breath and no vomiting     65 yo M with a chief complaint of a URI. This been going on for the past couple days. Cough congestion fevers. Patient also has a history of hepatocellular carcinoma. Patient was going to see his oncologist this afternoon for more pain medicine when he felt like he needed to stop here. Denies sick contacts.   Past Medical History  Diagnosis Date  . Cirrhosis ([place])   . Renal disorder   . GI bleed   . Anxiety   . Arthritis   . Blood transfusion without reported diagnosis   . Cancer ([place])   . Cataract   . Depression   . GERD (gastroesophageal reflux disease)   . Hypertension   . Substance abuse   . Tuberculosis    Past Surgical History  Procedure Laterality Date  . Abdominal surgery      band around gut at [facility]  . Throat surgery    . Eye surgery  2013      Bilateral cataract surgery     Family History  Problem Relation Age of Onset  . Heart disease Mother   . Cancer Father     esophagus cancer  . Cancer Brother   . Heart disease Brother     triple bypass   . Cancer Brother    Social History  Substance Use Topics  . Smoking status: Current Every Day Smoker -- 1.00 packs/day for 49 years    Types: Cigarettes    Start date: 03/20/1973  . Smokeless  tobacco: Never Used     Comment: 08-18-14  still smoking  . Alcohol Use: 0.0 oz/week    0 Standard drinks or equivalent per week    Review of Systems  Constitutional: Positive for fever and fatigue. Negative for chills.  HENT: Positive for congestion. Negative for facial swelling.   Eyes: Negative for discharge and visual disturbance.  Respiratory: Positive for cough. Negative for shortness of breath.   Cardiovascular: Negative for chest pain and palpitations.  Gastrointestinal: Positive for abdominal pain. Negative for vomiting and diarrhea.  Musculoskeletal: Negative for myalgias and arthralgias.  Skin: Negative for color change and rash.  Neurological: Negative for tremors, syncope and headaches.  Psychiatric/Behavioral: Negative for confusion and dysphoric mood.      Allergies  Penicillins; Tylenol; and Codeine  Home Medications   Prior to Admission medications   Medication Sig Start Date End Date Taking? Authorizing Provider  fentaNYL (DURAGESIC - DOSED MCG/HR) 25 MCG/HR patch Place 1 patch (25 mcg total) onto the skin every 3 (three) days. 02/01/15   Volanda Napoleon, MD  fludrocortisone (FLORINEF) 0.1 MG tablet Take 0.1 mg by mouth daily.    Historical Provider, MD  folic acid (FOLVITE) 1 MG tablet Take 1 tablet (1 mg total) by mouth daily. 12/14/14   Collier Salina  Oletha Cruel, MD  furosemide (LASIX) 40 MG tablet Take 1 tablet (40 mg total) by mouth daily. 09/09/14   Volanda Napoleon, MD  oxyCODONE (ROXICODONE) 5 MG immediate release tablet Take 1-2 pills, if needed, every 6 hours for pain secondary to liver cancer. 03/13/15   Deno Etienne, DO  Oxycodone HCl 10 MG TABS Take 0.5 tablets (5 mg total) by mouth every 6 (six) hours as needed. 03/06/15   Volanda Napoleon, MD  pantoprazole (PROTONIX) 40 MG tablet Take 1 tablet (40 mg total) by mouth daily. 12/14/14   Volanda Napoleon, MD  propranolol (INDERAL) 10 MG tablet Take 1 tablet (10 mg total) by mouth 2 (two) times daily. 03/08/15   Percell Miller Saguier,  PA-C  spironolactone (ALDACTONE) 50 MG tablet Take 1 tablet (50 mg total) by mouth daily. 01/13/15   Volanda Napoleon, MD   BP 97/58 mmHg  Pulse 44  Temp(Src) 97.8 F (36.6 C) (Oral)  Resp 16  Ht 5\' 5"  (1.651 m)  Wt 124 lb (56.246 kg)  BMI 20.63 kg/m2  SpO2 100% Physical Exam  Constitutional: He is oriented to person, place, and time. He appears well-developed and well-nourished.  HENT:  Head: Normocephalic and atraumatic.  Swollen turbinates, posterior nasal drip, no noted sinus ttp, tm normal bilaterally.    Eyes: EOM are normal. Pupils are equal, round, and reactive to light.  Neck: Normal range of motion. Neck supple. No JVD present.  Cardiovascular: Normal rate and regular rhythm.  Exam reveals no gallop and no friction rub.   No murmur heard. Pulmonary/Chest: No respiratory distress. He has no wheezes. He has no rales.  Abdominal: He exhibits no distension. There is no tenderness. There is no rebound and no guarding.  Musculoskeletal: Normal range of motion.  Neurological: He is alert and oriented to person, place, and time.  Skin: No rash noted. No pallor.  Psychiatric: He has a normal mood and affect. His behavior is normal.  Nursing note and vitals reviewed.   ED Course  Procedures (including critical care time) Labs Review Labs Reviewed  URINALYSIS, ROUTINE W REFLEX MICROSCOPIC (NOT AT [facility]) - Abnormal; Notable for the following:    Ketones, ur 15 (*)    Leukocytes, UA TRACE (*)    All other components within normal limits  CBC WITH DIFFERENTIAL/PLATELET - Abnormal; Notable for the following:    WBC 3.7 (*)    RBC 3.48 (*)    Hemoglobin 10.9 (*)    HCT 32.5 (*)    Platelets 90 (*)    Lymphs Abs 0.2 (*)    All other components within normal limits  COMPREHENSIVE METABOLIC PANEL - Abnormal; Notable for the following:    CO2 20 (*)    Glucose, Bld 211 (*)    BUN 25 (*)    Creatinine, Ser 1.31 (*)    Calcium 10.5 (*)    Albumin 3.3 (*)    Total Bilirubin 1.5  (*)    GFR calc non Af Amer 55 (*)    All other components within normal limits  URINE MICROSCOPIC-ADD ON - Abnormal; Notable for the following:    Bacteria, UA RARE (*)    All other components within normal limits  CULTURE, BLOOD (ROUTINE X 2)  CULTURE, BLOOD (ROUTINE X 2)  LIPASE, BLOOD    Imaging Review Dg Chest 2 View  03/13/2015  CLINICAL DATA:  Cough, fever, and body aches.  Myalgias EXAM: CHEST  2 VIEW COMPARISON:  08/30/2014 FINDINGS: Innumerable tiny  calcified pulmonary nodules from remote granulomatous disease or varicella pneumonia. There is no edema, consolidation, effusion, or pneumothorax. Normal heart size and mediastinal contours. Splenomegaly, known from 2016 abdominal CT and associated with cirrhosis. Endoluminal clip at the GE junction. IMPRESSION: No active cardiopulmonary disease. Electronically Signed   By: Monte Fantasia M.D.   On: 03/13/2015 14:51   Ct Abdomen Pelvis W Contrast  03/13/2015  CLINICAL DATA:  Body aches, nausea, abdominal pain, and fever over the last few days. History of liver cancer. Hypertension. Prior chemo embolization of hepatocellular carcinoma. EXAM: CT ABDOMEN AND PELVIS WITH CONTRAST TECHNIQUE: Multidetector CT imaging of the abdomen and pelvis was performed using the standard protocol following bolus administration of intravenous contrast. CONTRAST:  127mL OMNIPAQUE IOHEXOL 300 MG/ML  SOLN COMPARISON:  12/03/2014 MRI abdomen and 08/30/2014 CT scan FINDINGS: Lower chest: Innumerable scattered tiny calcific nodules in the lung bases, similar concentration to the prior exam, favoring old granulomatous disease or old viral process. Lack of progression argues against other causes for calcified pulmonary nodules. Small type 1 hiatal hernia. Hepatobiliary: Cirrhosis. Compared to 12/03/14, the prior nodular implants of tumor along the original mass site have enlarged. For example, the left anterolateral nodule along the underlying cystic lesion measures 1.9 by  2.0 cm on image 16 of series 2, previously about 7 mm on 12/03/2014. The right anterolateral lesion measures 2.6 by 1.8 cm on image 18 series 2, and previously measured 1.4 by 1.1 cm. There is also some additional rim enhancement which appears slightly increased from prior posteriorly. The tumor abuts the intrahepatic IVC. No additional foci of tumor outside of the original caudate lesion. Several small gallstones are settled dependently in the gallbladder on image 27 series 2. The common bile duct measures up to 1.3 cm, and there appears to be either debris or gallstones in the distal CBD on images 32 through 35 of series 2. This is similar to the prior MRI which showed material in the distal CBD. Pancreas: Unremarkable Spleen: Splenomegaly, spleen 16.1 cm craniocaudad. Adrenals/Urinary Tract: 3.1 by 2.4 cm simple cyst in the right kidney upper pole. Other small hypodense renal lesions are likely cysts although technically too small to characterize. Stomach/Bowel: There is a clip along the gastroesophageal junction lumen, uncertain significance. Borderline wall thickening in the gastric antrum and duodenal bulb low. Appendix normal. Air-fluid levels are present in the descending colon and rectum indicating a diarrheal process. There is of wall thickening in the large bowel which are probably peristaltic but technically nonspecific. Vascular/Lymphatic: Aortoiliac atherosclerotic vascular disease. Portal vein patent, splenic vein patent. Small collaterals for example adjacent to the adrenal gland on image 20 series 2. Gastroesophageal varices. Reproductive: Calcifications along the right prostate apex. Other: Considerable ascites scattered throughout the abdomen and pelvis. Musculoskeletal: Chronic superior endplate concavity at multiple lumbar levels likely from mild chronic superior endplate compression fractures. IMPRESSION: 1. Air-fluid levels in the distal colon and rectum suggesting a diarrheal process. 2.  There has been some interval growth in the foci of residual tumor along the caudate lobe mass which is undergone prior chemo embolization. 3. Similar appearance of cholelithiasis and choledocholithiasis with CBD currently measuring up to 1.3 cm diameter. 4. Tiny calcific nodules in the lung bases, likely due to prior viral process such as varicella, or old granulomatous disease. 5. 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 6. Small type 1 hiatal hernia. Electronically Signed   By: Van Clines M.D.   On: 03/13/2015 14:53   I  have personally reviewed and evaluated these images and lab results as part of my medical decision-making.   EKG Interpretation None      MDM   Final diagnoses:  Influenza-like illness    65 yo M with a chief complaints of cough congestion fevers and chills. Patient's symptoms seem typical of flulike illness. However patient with history of liver cancer having some worsening of his chronic abdominal pain. Will obtain a CT scan of the abdomen and pelvis. Chest x-ray and urine to evaluate for bacterial source of infection.  Labwork unremarkable. CT scan with worsening of his cancer. Chronic common bile duct stones.  6:47 PM:  I have discussed the diagnosis/risks/treatment options with the patient and family and believe the pt to be eligible for discharge home to follow-up with PCP. We also discussed returning to the ED immediately if new or worsening sx occur. We discussed the sx which are most concerning (e.g., sudden worsening pain, fever, inability to tolerate by mouth) that necessitate immediate return. Medications administered to the patient during their visit and any new prescriptions provided to the patient are listed below.  Medications given during this visit Medications  sodium chloride 0.9 % bolus 1,000 mL (0 mLs Intravenous Stopped 03/13/15 1516)  morphine 4 MG/ML injection 4 mg (4 mg Intravenous Given 03/13/15 1403)   ondansetron (ZOFRAN) injection 4 mg (4 mg Intravenous Given 03/13/15 1403)  iohexol (OMNIPAQUE) 300 MG/ML solution 100 mL (100 mLs Intravenous Contrast Given 03/13/15 1429)  morphine 4 MG/ML injection 4 mg (4 mg Intravenous Given 03/13/15 1525)  ibuprofen (ADVIL,MOTRIN) tablet 800 mg (800 mg Oral Given 03/13/15 1525)    Discharge Medication List as of 03/13/2015  4:00 PM      The patient appears reasonably screen and/or stabilized for discharge and I doubt any other medical condition or other [facility] requiring further screening, evaluation, or treatment in the ED at this time prior to discharge.    Deno Etienne, DO 03/13/15 [phone]

## 2015-03-13 NOTE — Discharge Instructions (Signed)
Take 4 over the counter ibuprofen tablets 3 times a day or 2 over-the-counter naproxen tablets twice a day for pain.  Influenza, Adult Influenza ("the flu") is a viral infection of the respiratory tract. It occurs more often in winter months because people spend more time in close contact with one another. Influenza can make you feel very sick. Influenza easily spreads from person to person (contagious). CAUSES  Influenza is caused by a virus that infects the respiratory tract. You can catch the virus by breathing in droplets from an infected person's cough or sneeze. You can also catch the virus by touching something that was recently contaminated with the virus and then touching your mouth, nose, or eyes. RISKS AND COMPLICATIONS You may be at risk for a more severe case of influenza if you smoke cigarettes, have diabetes, have chronic heart disease (such as heart failure) or lung disease (such as asthma), or if you have a weakened immune system. Elderly people and pregnant women are also at risk for more serious infections. The most common problem of influenza is a lung infection (pneumonia). Sometimes, this problem can require emergency medical care and may be life threatening. SIGNS AND SYMPTOMS  Symptoms typically last 4 to 10 days and may include:  Fever.  Chills.  Headache, body aches, and muscle aches.  Sore throat.  Chest discomfort and cough.  Poor appetite.  Weakness or feeling tired.  Dizziness.  Nausea or vomiting. DIAGNOSIS  Diagnosis of influenza is often made based on your history and a physical exam. A nose or throat swab test can be done to confirm the diagnosis. TREATMENT  In mild cases, influenza goes away on its own. Treatment is directed at relieving symptoms. For more severe cases, your health care provider may prescribe antiviral medicines to shorten the sickness. Antibiotic medicines are not effective because the infection is caused by a virus, not by  bacteria. HOME CARE INSTRUCTIONS  Take medicines only as directed by your health care provider.  Use a cool mist humidifier to make breathing easier.  Get plenty of rest until your temperature returns to normal. This usually takes 3 to 4 days.  Drink enough fluid to keep your urine clear or pale yellow.  Cover yourmouth and nosewhen coughing or sneezing,and wash your handswellto prevent thevirusfrom spreading.  Stay homefromwork orschool untilthe fever is gonefor at least 74full day. PREVENTION  An annual influenza vaccination (flu shot) is the best way to avoid getting influenza. An annual flu shot is now routinely recommended for all adults in the [place] IF:  You experiencechest pain, yourcough worsens,or you producemore mucus.  Youhave nausea,vomiting, ordiarrhea.  Your fever returns or gets worse. SEEK IMMEDIATE MEDICAL CARE IF:  You havetrouble breathing, you become short of breath,or your skin ornails becomebluish.  You have severe painor stiffnessin the neck.  You develop a sudden headache, or pain in the face or ear.  You have nausea or vomiting that you cannot control. MAKE SURE YOU:   Understand these instructions.  Will watch your condition.  Will get help right away if you are not doing well or get worse.   This information is not intended to replace advice given to you by your health care provider. Make sure you discuss any questions you have with your health care provider.   Document Released: 01/12/2000 Document Revised: 02/04/2014 Document Reviewed: 04/15/2011 Elsevier Interactive Patient Education [employer].

## 2015-03-13 NOTE — ED Notes (Signed)
Generalized body aches and nausea since last night. Hx of liver cancer.

## 2015-03-13 NOTE — ED Notes (Signed)
Patient refuses to change into a gown, patient states "it's too damn cold to change."  EMT offered patient warm blankets, patient declines.

## 2015-03-15 ENCOUNTER — Other Ambulatory Visit: Payer: Medicare HMO

## 2015-03-15 ENCOUNTER — Ambulatory Visit: Payer: Medicare HMO | Admitting: Family

## 2015-03-16 ENCOUNTER — Encounter: Payer: Self-pay | Admitting: Family

## 2015-03-16 ENCOUNTER — Other Ambulatory Visit: Payer: Medicare HMO

## 2015-03-16 ENCOUNTER — Ambulatory Visit (HOSPITAL_BASED_OUTPATIENT_CLINIC_OR_DEPARTMENT_OTHER): Payer: Medicare HMO | Admitting: Family

## 2015-03-16 VITALS — BP 107/58 | HR 73 | Temp 97.6°F | Resp 16 | Ht 65.0 in | Wt 126.0 lb

## 2015-03-16 DIAGNOSIS — G894 Chronic pain syndrome: Secondary | ICD-10-CM | POA: Diagnosis not present

## 2015-03-16 DIAGNOSIS — C220 Liver cell carcinoma: Secondary | ICD-10-CM | POA: Diagnosis not present

## 2015-03-16 DIAGNOSIS — B1920 Unspecified viral hepatitis C without hepatic coma: Secondary | ICD-10-CM | POA: Diagnosis not present

## 2015-03-16 DIAGNOSIS — K717 Toxic liver disease with fibrosis and cirrhosis of liver: Secondary | ICD-10-CM

## 2015-03-16 MED ORDER — OXYCODONE HCL ER 20 MG PO T12A: 20.0000 mg | EXTENDED_RELEASE_TABLET | Freq: Two times a day (BID) | ORAL | Status: DC

## 2015-03-16 MED ORDER — OXYCODONE HCL 5 MG PO TABS: 5.0000 mg | ORAL_TABLET | Freq: Four times a day (QID) | ORAL | Status: DC | PRN

## 2015-03-16 MED FILL — oxyCODONE HCL 5 MG TABS: 5 | 15 days supply | Qty: 120 | Fill #0

## 2015-03-16 NOTE — Progress Notes (Signed)
Hematology and Oncology Follow Up Visit  Antonio Fleming [id number] 1950-08-09 65 y.o. 03/16/2015   Principle Diagnosis:  Hepatocellular carcinoma Hepatitis C with cirrhosis/ascites  Current Therapy:   Observation S/p chemo embolization with drug eluting beads - 10/27/2014    Interim History:  Antonio Fleming is here today for a follow-up he has missed . He was in the ED on Monday with c/o generalized body aches. His CT scan showed some interval growth in the foci of residual tumor along the caudate lobe mass previously treated via chemo embolization (intrahepatic doxorubicin).  He has an MRI of the abdomen scheduled for this upcoming Saturday and follows up with Radiology on Tuesday February 21st.  He is symptomatic at this time with abdominal discomfort. He is guarded during his exam. There is no fluid wave or distention.  No fever, chills, n/v, cough, rash, dizziness, chest pain, palpitations or changes in bowel or bladder habits.  He has some SOB with exertion at times. He is smoking 2 cartons of cigarettes/mo.  She states that he has "puffiness" in his feet and ankles that comes and goes. He takes lasix 40 mg daily which helps with this. No swelling, tenderness, numbness or tingling in his extremities at this time.   Medications:    Medication List       This list is accurate as of: 03/16/15  2:46 PM.  Always use your most recent med list.               fentaNYL 25 MCG/HR patch  Commonly known as:  [place] - dosed mcg/hr  Place 1 patch (25 mcg total) onto the skin every 3 (three) days.     fludrocortisone 0.1 MG tablet  Commonly known as:  FLORINEF  Take 0.1 mg by mouth daily.     folic acid 1 MG tablet  Commonly known as:  FOLVITE  Take 1 tablet (1 mg total) by mouth daily.     furosemide 40 MG tablet  Commonly known as:  LASIX  Take 1 tablet (40 mg total) by mouth daily.     oxyCODONE 5 MG immediate release tablet  Commonly known as:  ROXICODONE  Take 1-2 tablets (5-10  mg total) by mouth every 6 (six) hours as needed for severe pain.     oxyCODONE 20 mg 12 hr tablet  Commonly known as:  OXYCONTIN  Take 1 tablet (20 mg total) by mouth every 12 (twelve) hours.     pantoprazole 40 MG tablet  Commonly known as:  PROTONIX  Take 1 tablet (40 mg total) by mouth daily.     propranolol 10 MG tablet  Commonly known as:  INDERAL  Take 1 tablet (10 mg total) by mouth 2 (two) times daily.     spironolactone 50 MG tablet  Commonly known as:  ALDACTONE  Take 1 tablet (50 mg total) by mouth daily.        Allergies:  Allergies  Allergen Reactions  . Penicillins     N/V N/V Has patient had a PCN reaction causing immediate rash, facial/tongue/throat swelling, SOB or lightheadedness with hypotension: Nauseous/vommitting, sweats Has patient had a PCN reaction causing severe rash involving mucus membranes or skin necrosis: No Did PCN reaction that required hospitalization Yes was in the hospital Did PCN reaction occurring within the last 10 years: No- 60 years agoIf all of the above answers are "NO", then may proceed with Cephalosporin use.   . Tylenol [Acetaminophen]     Liver problems  .  Codeine Other (See Comments) and Rash    Sweating    Past Medical History, Surgical history, Social history, and Family History were reviewed and updated.  Review of Systems: All other 10 point review of systems is negative.   Physical Exam:  height is 5\' 5"  (1.651 m) and weight is 126 lb (57.153 kg). His oral temperature is 97.6 F (36.4 C). His blood pressure is 107/58 and his pulse is 73. His respiration is 16.   Wt Readings from Last 3 Encounters:  03/16/15 126 lb (57.153 kg)  03/13/15 124 lb (56.246 kg)  03/08/15 124 lb 6.4 oz (56.427 kg)    Ocular: Sclerae unicteric, pupils equal, round and reactive to light Ear-nose-throat: Oropharynx clear, dentition fair Lymphatic: No cervical supraclavicular or adenopathy Lungs no rales or rhonchi, good excursion  bilaterally Heart regular rate and rhythm, no murmur appreciated Abd soft, tender in both left and right lower quadrants, no fluid wave, no liver or spleen tip palpated on exam, positive bowel sounds MSK no focal spinal tenderness, no joint edema Neuro: non-focal, well-oriented, appropriate affect Breasts: Deferred  Lab Results  Component Value Date   WBC 3.7* 03/13/2015   HGB 10.9* 03/13/2015   HCT 32.5* 03/13/2015   MCV 93.4 03/13/2015   PLT 90* 03/13/2015   Lab Results  Component Value Date   FERRITIN 191 09/09/2014   IRON 97 09/09/2014   TIBC 210 09/09/2014   UIBC 113* 09/09/2014   IRONPCTSAT 46 09/09/2014   Lab Results  Component Value Date   RETICCTPCT 1.3 09/09/2014   RBC 3.48* 03/13/2015   RETICCTABS 34.1 09/09/2014   No results found for: KPAFRELGTCHN, LAMBDASER, KAPLAMBRATIO No results found for: IGGSERUM, IGA, IGMSERUM No results found for: Odetta Pink, SPEI   Chemistry      Component Value Date/Time   NA 141 03/13/2015 1405   NA 139 09/09/2014 1318   K 3.7 03/13/2015 1405   K 5.4* 09/09/2014 1318   CL 109 03/13/2015 1405   CL 109* 09/09/2014 1318   CO2 20* 03/13/2015 1405   CO2 19 09/09/2014 1318   BUN 25* 03/13/2015 1405   BUN 17 09/09/2014 1318   CREATININE 1.31* 03/13/2015 1405   CREATININE 1.41* 11/22/2014 0001      Component Value Date/Time   CALCIUM 10.5* 03/13/2015 1405   CALCIUM 10.7* 09/09/2014 1318   ALKPHOS 92 03/13/2015 1405   ALKPHOS 74 09/09/2014 1318   AST 36 03/13/2015 1405   AST 33 09/09/2014 1318   ALT 20 03/13/2015 1405   ALT 20 09/09/2014 1318   BILITOT 1.5* 03/13/2015 1405   BILITOT 1.00 09/09/2014 1318     Impression and Plan: Antonio Fleming is 65 yo white male with hepatocellular carcinoma and history of Hep C. He was treated with chemo embolization with drug eluting beads in September 2016 and had a nice response at that time. He then developed generalized pain earlier  this week and went to the ED. CT of the abdomen revealed some interval growth in the foci of the residual tumor along the caudate lobe mass.  He has an appointment for an MRI of the abdomen this Saturday and will follow-up with radiation next Tuesday. We will see what his scan shows.  He is complaining of pain not controlled with the Roxicodone he received in the ED. I discussed this with Dr. Marin Olp and we will have him try Oxycontin ER 20 mg q 12 hours PRN and the Roxicodone 5  mg PRN for breakthrough pain.  We will then plan to see him back in 6 weeks for labs and follow-up. He will contact us with any questions or concerns. We can certainly see him sooner if need be.   Eliezer Bottom, NP 2/16/20172:46 PM

## 2015-03-18 ENCOUNTER — Ambulatory Visit (HOSPITAL_BASED_OUTPATIENT_CLINIC_OR_DEPARTMENT_OTHER)
Admission: RE | Admit: 2015-03-18 | Discharge: 2015-03-18 | Disposition: A | Discharge status: Home / Self Care | Payer: Medicare HMO | Source: Ambulatory Visit | Attending: Interventional Radiology | Admitting: Interventional Radiology

## 2015-03-18 DIAGNOSIS — R161 Splenomegaly, not elsewhere classified: Secondary | ICD-10-CM | POA: Diagnosis not present

## 2015-03-18 DIAGNOSIS — R188 Other ascites: Secondary | ICD-10-CM | POA: Insufficient documentation

## 2015-03-18 DIAGNOSIS — B1710 Acute hepatitis C without hepatic coma: Secondary | ICD-10-CM | POA: Diagnosis present

## 2015-03-18 DIAGNOSIS — K7460 Unspecified cirrhosis of liver: Secondary | ICD-10-CM | POA: Diagnosis not present

## 2015-03-18 DIAGNOSIS — K8050 Calculus of bile duct without cholangitis or cholecystitis without obstruction: Secondary | ICD-10-CM | POA: Insufficient documentation

## 2015-03-18 DIAGNOSIS — K8020 Calculus of gallbladder without cholecystitis without obstruction: Secondary | ICD-10-CM | POA: Insufficient documentation

## 2015-03-18 DIAGNOSIS — K838 Other specified diseases of biliary tract: Secondary | ICD-10-CM | POA: Diagnosis not present

## 2015-03-18 DIAGNOSIS — C220 Liver cell carcinoma: Secondary | ICD-10-CM | POA: Diagnosis not present

## 2015-03-18 LAB — CULTURE, BLOOD (ROUTINE X 2)
Culture: NO GROWTH
Culture: NO GROWTH

## 2015-03-18 MED ORDER — GADOBENATE DIMEGLUMINE 529 MG/ML IV SOLN
11.0000 mL | Freq: Once | INTRAVENOUS | Status: AC | PRN
  Administered 2015-03-18: 11 mL via INTRAVENOUS

## 2015-03-21 ENCOUNTER — Other Ambulatory Visit: Payer: Self-pay | Admitting: Interventional Radiology

## 2015-03-21 ENCOUNTER — Ambulatory Visit
Admission: RE | Admit: 2015-03-21 | Discharge: 2015-03-21 | Disposition: A | Discharge status: Home / Self Care | Payer: Medicare HMO | Source: Ambulatory Visit | Attending: Interventional Radiology | Admitting: Interventional Radiology

## 2015-03-21 DIAGNOSIS — C220 Liver cell carcinoma: Secondary | ICD-10-CM

## 2015-03-21 HISTORY — DX: Anemia, unspecified: D64.9

## 2015-03-21 NOTE — Progress Notes (Addendum)
Chief Complaint: Patient was seen in follow-up today for hepatocellular cancer at the request of Lyndle Pang  Initial Referring Physician(s): Ennever, Peter  History of Present Illness: Antonio Fleming is a 65 y.o. 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 He has not gone prior antiretroviral therapy for his hepatitis C. He has had several paracenteses for his ascites. [name] alpha-fetoprotein is normal at 1.7. He falls into the approximately 30% of hepatocellular carcinomas which are non-secretor's of AFP.  He underwent chemoembolization with drug-eluting beads on 10/27/2014 and presents today for his second follow-up evaluation.  He has been doing well clinically. He is getting a proximal A 12 pounds since his last visit. He denies significant right upper quadrant pain, fever, chills, shortness of breath or other systemic symptoms.  Past Medical History  Diagnosis Date  . Cirrhosis ([place])   . Renal disorder   . GI bleed   . Anxiety   . Arthritis   . Blood transfusion without reported diagnosis   . Cancer ([place])   . Cataract   . Depression   . GERD (gastroesophageal reflux disease)   . Hypertension   . Substance abuse   . Tuberculosis   . Absolute anemia 09/26/2008    Past Surgical History  Procedure Laterality Date  . Abdominal surgery      band around gut at [facility]  . Throat surgery    . Eye surgery  2013      Bilateral cataract surgery      Allergies: Penicillins; Tylenol; and Codeine  Medications: Prior to Admission medications   Medication Sig Start Date End Date Taking? Authorizing Provider  fentaNYL (DURAGESIC - DOSED MCG/HR) 25 MCG/HR patch Place 1 patch (25 mcg total) onto the skin every 3 (three) days. 02/01/15   Volanda Napoleon, MD  folic acid (FOLVITE) 1 MG tablet Take 1 tablet (1 mg total) by  mouth daily. 12/14/14   Volanda Napoleon, MD  furosemide (LASIX) 40 MG tablet Take 1 tablet (40 mg total) by mouth daily. 09/09/14   Volanda Napoleon, MD  oxyCODONE (ROXICODONE) 5 MG immediate release tablet Take 1-2 tablets (5-10 mg total) by mouth every 6 (six) hours as needed for severe pain. 03/16/15   Eliezer Bottom, NP  Oxycodone HCl 10 MG TABS  02/02/15   Historical Provider, MD  pantoprazole (PROTONIX) 40 MG tablet Take 1 tablet (40 mg total) by mouth daily. 12/14/14   Volanda Napoleon, MD  propranolol (INDERAL) 10 MG tablet Take 1 tablet (10 mg total) by mouth 2 (two) times daily. 03/08/15   Percell Miller Saguier, PA-C  spironolactone (ALDACTONE) 50 MG tablet Take 1 tablet (50 mg total) by mouth daily. 01/13/15   Volanda Napoleon, MD     Family History  Problem Relation Age of Onset  . Heart disease Mother   . Cancer Father     esophagus cancer  . Cancer Brother   . Heart disease Brother     triple bypass   . Cancer Brother     Social History   Social History  . Marital Status: Divorced    Spouse Name: N/A  . Number of Children: N/A  . Years of Education: N/A   Social History Main Topics  . Smoking status: Current Every Day Smoker -- 1.00 packs/day for 49 years    Types: Cigarettes  Start date: 03/20/1973  . Smokeless tobacco: Never Used     Comment: 08-18-14  still smoking  . Alcohol Use: 0.0 oz/week    0 Standard drinks or equivalent per week  . Drug Use: No  . Sexual Activity: Not Asked   Other Topics Concern  . None   Social History Narrative    ECOG Status: 1 - Symptomatic but completely ambulatory  Review of Systems: A 12 point ROS discussed and pertinent positives are indicated in the HPI above.  All other systems are negative.  Review of Systems  Vital Signs: BP 112/72 mmHg  Pulse 103  Temp(Src) 97.7 F (36.5 C)  Resp 18  Ht 5' 5.5" (1.664 m)  Wt 126 lb (57.153 kg)  BMI 20.64 kg/m2  SpO2 97%  Physical Exam  Constitutional: He is oriented to  person, place, and time. He appears well-developed. No distress.  HENT:  Head: Normocephalic and atraumatic.  Eyes: No scleral icterus.  Cardiovascular: Normal rate and regular rhythm.   Pulses:      Radial pulses are 2+ on the left side.  Nickolas Madrid B  Pulmonary/Chest: Effort normal.  Abdominal: Soft. He exhibits no distension. There is no tenderness.  Neurological: He is alert and oriented to person, place, and time.  Skin: Skin is warm and dry.  Psychiatric: He has a normal mood and affect. His behavior is normal.  Nursing note and vitals reviewed.    Imaging: Dg Chest 2 View  03/13/2015  CLINICAL DATA:  Cough, fever, and body aches.  Myalgias EXAM: CHEST  2 VIEW COMPARISON:  08/30/2014 FINDINGS: Innumerable tiny calcified pulmonary nodules from remote granulomatous disease or varicella pneumonia. There is no edema, consolidation, effusion, or pneumothorax. Normal heart size and mediastinal contours. Splenomegaly, known from 2016 abdominal CT and associated with cirrhosis. Endoluminal clip at the GE junction. IMPRESSION: No active cardiopulmonary disease. Electronically Signed   By: Monte Fantasia M.D.   On: 03/13/2015 14:51   Mr Abdomen W Wo Contrast  03/18/2015  CLINICAL DATA:  Hepatitis-C cirrhosis. Hepatocellular carcinoma of the caudate lobe status post DEB-TACE on 10/27/2014, presenting for follow-up. EXAM: MRI ABDOMEN WITHOUT AND WITH CONTRAST TECHNIQUE: Multiplanar multisequence MR imaging of the abdomen was performed both before and after the administration of intravenous contrast. CONTRAST:  42mL MULTIHANCE GADOBENATE DIMEGLUMINE 529 MG/ML IV SOLN COMPARISON:  03/13/2015 CT abdomen/ pelvis.  12/03/2014 MRI abdomen. FINDINGS: Lower chest: Clear lung bases. Hepatobiliary: Re- demonstrated is diffusely irregular liver surface with relative hypertrophy of the left liver lobe, in keeping with cirrhosis. Mild diffuse hepatic steatosis on chemical shift imaging. Re- demonstrated is a  caudate lobe mass measuring 4.5 x 3.8 cm, which is decreased in size from 5.2 x 4.6 cm on 12/03/2014. However, there are two enhancing nodular foci of viable tumor at the periphery of the mass that have increased in size in the interval measuring 2.0 cm on the right (increased from 1.6 cm) and 2.1 cm on the left (increased from 1.4 cm). There is a new 0.7 cm nodular focus of arterial phase hyperenhancement in segment 5 of the right liver lobe (series 12/image 29), which is occult on all other sequences, in keeping with a LI-RADS 3 lesion. There are multiple subcentimeter gallstones layering in the gallbladder, with no gallbladder wall thickening. Stable mild central intrahepatic biliary ductal dilatation and stable dilated common bile duct (10 mm diameter). Stable chronic 8 mm stone in the lower third of the common bile duct. Pancreas: No pancreatic mass or duct  dilation.  No pancreas divisum. Spleen: Stable mild-to-moderate splenomegaly (craniocaudal splenic length 14.4 cm, previously 14.7 cm, not appreciably changed). No splenic mass. Adrenals/Urinary Tract: Normal adrenals. No hydronephrosis. Bilateral simple renal cysts, largest 3.1 cm in the posterior upper right kidney. No suspicious renal masses. Stomach/Bowel: Grossly normal visualized stomach. Visualized small and large bowel is normal caliber, with no bowel wall thickening. Vascular/Lymphatic: Normal caliber abdominal aorta. Patent portal, splenic, hepatic and renal veins. Stable mild gastroesophageal and paraumbilical varices. No pathologically enlarged lymph nodes in the abdomen. Other: Small to moderate volume abdominal ascites. Musculoskeletal: No aggressive appearing focal osseous lesions. IMPRESSION: 1. Interval growth of nodular foci of viable tumor at the periphery of the treated caudate lobe hepatocellular carcinoma, now approximately 30% viable. 2. New indeterminate 0.7 cm nodular focus of arterial phase hyperenhancement in segment 5 of the right  liver lobe, a LI-RADS 3 lesion, for which follow-up MRI abdomen with and without intravenous contrast is recommended in 3-6 months. 3. Cirrhosis. Stable mild-to-moderate splenomegaly. Stable small gastroesophageal and periumbilical varices. Small moderate volume ascites. 4. Stable cholelithiasis, choledocholithiasis and biliary ductal dilatation. Electronically Signed   By: Ilona Sorrel M.D.   On: 03/18/2015 15:29   Ct Abdomen Pelvis W Contrast  03/13/2015  CLINICAL DATA:  Body aches, nausea, abdominal pain, and fever over the last few days. History of liver cancer. Hypertension. Prior chemo embolization of hepatocellular carcinoma. EXAM: CT ABDOMEN AND PELVIS WITH CONTRAST TECHNIQUE: Multidetector CT imaging of the abdomen and pelvis was performed using the standard protocol following bolus administration of intravenous contrast. CONTRAST:  164mL OMNIPAQUE IOHEXOL 300 MG/ML  SOLN COMPARISON:  12/03/2014 MRI abdomen and 08/30/2014 CT scan FINDINGS: Lower chest: Innumerable scattered tiny calcific nodules in the lung bases, similar concentration to the prior exam, favoring old granulomatous disease or old viral process. Lack of progression argues against other causes for calcified pulmonary nodules. Small type 1 hiatal hernia. Hepatobiliary: Cirrhosis. Compared to 12/03/14, the prior nodular implants of tumor along the original mass site have enlarged. For example, the left anterolateral nodule along the underlying cystic lesion measures 1.9 by 2.0 cm on image 16 of series 2, previously about 7 mm on 12/03/2014. The right anterolateral lesion measures 2.6 by 1.8 cm on image 18 series 2, and previously measured 1.4 by 1.1 cm. There is also some additional rim enhancement which appears slightly increased from prior posteriorly. The tumor abuts the intrahepatic IVC. No additional foci of tumor outside of the original caudate lesion. Several small gallstones are settled dependently in the gallbladder on image 27 series  2. The common bile duct measures up to 1.3 cm, and there appears to be either debris or gallstones in the distal CBD on images 32 through 35 of series 2. This is similar to the prior MRI which showed material in the distal CBD. Pancreas: Unremarkable Spleen: Splenomegaly, spleen 16.1 cm craniocaudad. Adrenals/Urinary Tract: 3.1 by 2.4 cm simple cyst in the right kidney upper pole. Other small hypodense renal lesions are likely cysts although technically too small to characterize. Stomach/Bowel: There is a clip along the gastroesophageal junction lumen, uncertain significance. Borderline wall thickening in the gastric antrum and duodenal bulb low. Appendix normal. Air-fluid levels are present in the descending colon and rectum indicating a diarrheal process. There is of wall thickening in the large bowel which are probably peristaltic but technically nonspecific. Vascular/Lymphatic: Aortoiliac atherosclerotic vascular disease. Portal vein patent, splenic vein patent. Small collaterals for example adjacent to the adrenal gland on image 20 series  2. Gastroesophageal varices. Reproductive: Calcifications along the right prostate apex. Other: Considerable ascites scattered throughout the abdomen and pelvis. Musculoskeletal: Chronic superior endplate concavity at multiple lumbar levels likely from mild chronic superior endplate compression fractures. IMPRESSION: 1. Air-fluid levels in the distal colon and rectum suggesting a diarrheal process. 2. There has been some interval growth in the foci of residual tumor along the caudate lobe mass which is undergone prior chemo embolization. 3. Similar appearance of cholelithiasis and choledocholithiasis with CBD currently measuring up to 1.3 cm diameter. 4. Tiny calcific nodules in the lung bases, likely due to prior viral process such as varicella, or old granulomatous disease. 5. 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 6. Small type 1 hiatal  hernia. Electronically Signed   By: Van Clines M.D.   On: 03/13/2015 14:53    Labs:  CBC:  Recent Labs  10/27/14 0950 11/22/14 0001 03/08/15 1504 03/13/15 1405  WBC 5.1 5.5 4.7 3.7*  HGB 10.2* 10.0* 10.6* 10.9*  HCT 30.2* 29.2* 31.5* 32.5*  PLT 86* 177 124.0* 90*    COAGS:  Recent Labs  10/27/14 0950 11/22/14 0001  INR 1.16 1.22  APTT 34  --     BMP:  Recent Labs  10/27/14 0950 10/28/14 0545 11/22/14 0001 03/08/15 1504 03/13/15 1405  NA 139 138 133* 136 141  K 3.9 4.4 4.6 4.5 3.7  CL 105 109 101 107 109  CO2 25 23 25 25  20*  GLUCOSE 115* 147* 132* 94 211*  BUN 37* 37* 26* 22 25*  CALCIUM 10.8* 9.8 10.7* 10.7* 10.5*  CREATININE 1.68* 1.71* 1.41* 1.19 1.31*  GFRNONAA 41* 40* 52*  --  55*  GFRAA 47* 46* 60  --  >60    LIVER FUNCTION TESTS:  Recent Labs  10/28/14 0545 11/22/14 0001 03/08/15 1504 03/13/15 1405  BILITOT 0.5 1.0 1.1 1.5*  AST 91* 17 23 36  ALT 27 9 14 20   ALKPHOS 60 93 87 92  PROT 6.8 7.2 7.4 7.5  ALBUMIN 3.2* 3.1* 3.4* 3.3*    TUMOR MARKERS:  Recent Labs  08/18/14 1506  AFPTM 1.7    Assessment and Plan:  Continues to do clinically well now 5 months status post drug-eluting bead chemotherapy embolization. Most recent MRI imaging from 03/18/2015 demonstrates some slight increase in peripheral nodular enhancement at the superior aspect of the previously treated segment 1 lesion. Additionally, there is one new nonspecific hyper and has a foci in the right liver which warrants continued attention on follow-up imaging.  1.) Please schedule for repeat drug-eluting bead transarterial chemoembolization (order placed by me).  Plan for radial artery access.   Electronically Signed: Jacqulynn Cadet 03/21/2015, 5:36 PM   I spent a total of   15 Minutes in face to face in clinical consultation, greater than 50% of which was counseling/coordinating care for hepatocellular cancer.

## 2015-03-24 ENCOUNTER — Other Ambulatory Visit: Payer: Self-pay | Admitting: *Deleted

## 2015-03-24 DIAGNOSIS — C220 Liver cell carcinoma: Secondary | ICD-10-CM

## 2015-03-24 DIAGNOSIS — G894 Chronic pain syndrome: Secondary | ICD-10-CM

## 2015-03-24 MED ORDER — OXYCODONE HCL 5 MG PO TABS: 5.0000 mg | ORAL_TABLET | Freq: Four times a day (QID) | ORAL | Status: DC | PRN

## 2015-03-27 ENCOUNTER — Other Ambulatory Visit: Payer: Self-pay | Admitting: *Deleted

## 2015-03-27 DIAGNOSIS — G894 Chronic pain syndrome: Secondary | ICD-10-CM

## 2015-03-27 DIAGNOSIS — C220 Liver cell carcinoma: Secondary | ICD-10-CM

## 2015-03-27 MED ORDER — OXYCODONE HCL ER 20 MG PO T12A: 20.0000 mg | EXTENDED_RELEASE_TABLET | Freq: Two times a day (BID) | ORAL | Status: DC

## 2015-04-07 MED ORDER — LC BEADS 100-300UM IN SALINE
150.0000 mg | Freq: Once | Status: AC
  Administered 2015-04-11: 150 mg via INTRA_ARTERIAL
  Filled 2015-04-07: qty 75

## 2015-04-10 ENCOUNTER — Other Ambulatory Visit: Payer: Self-pay | Admitting: General Surgery

## 2015-04-11 ENCOUNTER — Ambulatory Visit (HOSPITAL_COMMUNITY)
Admission: RE | Admit: 2015-04-11 | Discharge: 2015-04-11 | Disposition: A | Discharge status: Home / Self Care | Payer: Medicare HMO | Source: Ambulatory Visit | Attending: Interventional Radiology | Admitting: Interventional Radiology

## 2015-04-11 ENCOUNTER — Encounter (HOSPITAL_COMMUNITY): Payer: Self-pay

## 2015-04-11 ENCOUNTER — Observation Stay (HOSPITAL_COMMUNITY)
Admission: RE | Admit: 2015-04-11 | Discharge: 2015-04-12 | Disposition: A | Discharge status: Home / Self Care | Payer: Medicare HMO | Source: Ambulatory Visit | Attending: Interventional Radiology | Admitting: Interventional Radiology

## 2015-04-11 DIAGNOSIS — M545 Low back pain: Secondary | ICD-10-CM | POA: Insufficient documentation

## 2015-04-11 DIAGNOSIS — Z79899 Other long term (current) drug therapy: Secondary | ICD-10-CM | POA: Diagnosis not present

## 2015-04-11 DIAGNOSIS — B1920 Unspecified viral hepatitis C without hepatic coma: Secondary | ICD-10-CM | POA: Insufficient documentation

## 2015-04-11 DIAGNOSIS — K7460 Unspecified cirrhosis of liver: Secondary | ICD-10-CM | POA: Insufficient documentation

## 2015-04-11 DIAGNOSIS — G8929 Other chronic pain: Secondary | ICD-10-CM | POA: Diagnosis not present

## 2015-04-11 DIAGNOSIS — C220 Liver cell carcinoma: Secondary | ICD-10-CM | POA: Diagnosis present

## 2015-04-11 DIAGNOSIS — Z79891 Long term (current) use of opiate analgesic: Secondary | ICD-10-CM | POA: Insufficient documentation

## 2015-04-11 LAB — CBC WITH DIFFERENTIAL/PLATELET
BASOS ABS: 0 10*3/uL (ref 0.0–0.1)
Basophils Relative: 1 %
Eosinophils Absolute: 0.9 10*3/uL — ABNORMAL HIGH (ref 0.0–0.7)
Eosinophils Relative: 18 %
HEMATOCRIT: 30.3 % — AB (ref 39.0–52.0)
HEMOGLOBIN: 10.3 g/dL — AB (ref 13.0–17.0)
LYMPHS PCT: 11 %
Lymphs Abs: 0.6 10*3/uL — ABNORMAL LOW (ref 0.7–4.0)
MCH: 32.5 pg (ref 26.0–34.0)
MCHC: 34 g/dL (ref 30.0–36.0)
MCV: 95.6 fL (ref 78.0–100.0)
MONO ABS: 0.4 10*3/uL (ref 0.1–1.0)
MONOS PCT: 7 %
NEUTROS ABS: 3.3 10*3/uL (ref 1.7–7.7)
NEUTROS PCT: 63 %
Platelets: 84 10*3/uL — ABNORMAL LOW (ref 150–400)
RBC: 3.17 MIL/uL — ABNORMAL LOW (ref 4.22–5.81)
RDW: 14.6 % (ref 11.5–15.5)
WBC: 5.2 10*3/uL (ref 4.0–10.5)

## 2015-04-11 LAB — COMPREHENSIVE METABOLIC PANEL
ALK PHOS: 90 U/L (ref 38–126)
ALT: 15 U/L — ABNORMAL LOW (ref 17–63)
AST: 23 U/L (ref 15–41)
Albumin: 3.3 g/dL — ABNORMAL LOW (ref 3.5–5.0)
Anion gap: 7 (ref 5–15)
BILIRUBIN TOTAL: 1.2 mg/dL (ref 0.3–1.2)
BUN: 32 mg/dL — AB (ref 6–20)
CALCIUM: 10.6 mg/dL — AB (ref 8.9–10.3)
CO2: 24 mmol/L (ref 22–32)
CREATININE: 1.52 mg/dL — AB (ref 0.61–1.24)
Chloride: 108 mmol/L (ref 101–111)
GFR calc Af Amer: 53 mL/min — ABNORMAL LOW (ref >60)
GFR, EST NON AFRICAN AMERICAN: 46 mL/min — AB (ref >60)
GLUCOSE: 134 mg/dL — AB (ref 65–99)
POTASSIUM: 4.5 mmol/L (ref 3.5–5.1)
Sodium: 139 mmol/L (ref 135–145)
TOTAL PROTEIN: 7.3 g/dL (ref 6.5–8.1)

## 2015-04-11 LAB — PROTIME-INR
INR: 1.26 (ref 0.00–1.49)
PROTHROMBIN TIME: 16 s — AB (ref 11.6–15.2)

## 2015-04-11 MED ORDER — OXYCODONE HCL ER 20 MG PO T12A
20.0000 mg | EXTENDED_RELEASE_TABLET | Freq: Two times a day (BID) | ORAL | Status: DC
  Administered 2015-04-11 – 2015-04-12 (×2): 20 mg via ORAL
  Filled 2015-04-11 (×2): qty 1

## 2015-04-11 MED ORDER — DEXAMETHASONE SODIUM PHOSPHATE 10 MG/ML IJ SOLN
8.0000 mg | INTRAMUSCULAR | Status: AC
  Administered 2015-04-11: 8 mg via INTRAVENOUS

## 2015-04-11 MED ORDER — SODIUM CHLORIDE 0.9 % IV SOLN
INTRAVENOUS | Status: DC
  Administered 2015-04-11: 13:00:00 via INTRAVENOUS

## 2015-04-11 MED ORDER — ONDANSETRON HCL 4 MG/2ML IJ SOLN
4.0000 mg | Freq: Four times a day (QID) | INTRAMUSCULAR | Status: DC | PRN
  Administered 2015-04-12: 4 mg via INTRAVENOUS
  Filled 2015-04-11: qty 2

## 2015-04-11 MED ORDER — METRONIDAZOLE IN NACL 5-0.79 MG/ML-% IV SOLN
500.0000 mg | INTRAVENOUS | Status: AC
  Administered 2015-04-11: 500 mg via INTRAVENOUS
  Filled 2015-04-11: qty 100

## 2015-04-11 MED ORDER — FENTANYL 25 MCG/HR TD PT72
25.0000 ug | MEDICATED_PATCH | TRANSDERMAL | Status: DC
  Administered 2015-04-11: 25 ug via TRANSDERMAL
  Filled 2015-04-11: qty 1

## 2015-04-11 MED ORDER — FENTANYL CITRATE (PF) 100 MCG/2ML IJ SOLN
INTRAMUSCULAR | Status: AC | PRN
  Administered 2015-04-11: 50 ug via INTRAVENOUS
  Administered 2015-04-11 (×2): 25 ug via INTRAVENOUS

## 2015-04-11 MED ORDER — ONDANSETRON HCL 4 MG/2ML IJ SOLN
INTRAMUSCULAR | Status: AC
  Administered 2015-04-11: 8 mg via INTRAVENOUS
  Filled 2015-04-11: qty 4

## 2015-04-11 MED ORDER — PROMETHAZINE HCL 25 MG RE SUPP
25.0000 mg | Freq: Three times a day (TID) | RECTAL | Status: DC | PRN
Start: 2015-04-11 — End: 2015-04-12

## 2015-04-11 MED ORDER — FOLIC ACID 1 MG PO TABS
1.0000 mg | ORAL_TABLET | Freq: Every day | ORAL | Status: DC
  Administered 2015-04-11 – 2015-04-12 (×2): 1 mg via ORAL
  Filled 2015-04-11 (×2): qty 1

## 2015-04-11 MED ORDER — SPIRONOLACTONE 25 MG PO TABS
50.0000 mg | ORAL_TABLET | Freq: Every day | ORAL | Status: DC
  Administered 2015-04-11 – 2015-04-12 (×2): 50 mg via ORAL
  Filled 2015-04-11 (×2): qty 2

## 2015-04-11 MED ORDER — PROPRANOLOL HCL 10 MG PO TABS
10.0000 mg | ORAL_TABLET | Freq: Two times a day (BID) | ORAL | Status: DC
  Administered 2015-04-12: 10 mg via ORAL
  Filled 2015-04-11 (×2): qty 1

## 2015-04-11 MED ORDER — HEPARIN SODIUM (PORCINE) 1000 UNIT/ML IJ SOLN
INTRAMUSCULAR | Status: AC
  Filled 2015-04-11: qty 1

## 2015-04-11 MED ORDER — FUROSEMIDE 40 MG PO TABS
40.0000 mg | ORAL_TABLET | Freq: Every day | ORAL | Status: DC
  Administered 2015-04-11 – 2015-04-12 (×2): 40 mg via ORAL
  Filled 2015-04-11 (×2): qty 1

## 2015-04-11 MED ORDER — PROMETHAZINE HCL 25 MG PO TABS: 25.0000 mg | ORAL_TABLET | Freq: Three times a day (TID) | ORAL | Status: DC | PRN

## 2015-04-11 MED ORDER — LIDOCAINE HCL 1 % IJ SOLN
INTRAMUSCULAR | Status: AC
  Filled 2015-04-11: qty 20

## 2015-04-11 MED ORDER — DEXAMETHASONE SODIUM PHOSPHATE 10 MG/ML IJ SOLN
INTRAMUSCULAR | Status: AC
  Administered 2015-04-11: 8 mg via INTRAVENOUS
  Filled 2015-04-11: qty 1

## 2015-04-11 MED ORDER — DOCUSATE SODIUM 100 MG PO CAPS
100.0000 mg | ORAL_CAPSULE | Freq: Two times a day (BID) | ORAL | Status: DC
  Administered 2015-04-11 – 2015-04-12 (×2): 100 mg via ORAL
  Filled 2015-04-11 (×2): qty 1

## 2015-04-11 MED ORDER — MIDAZOLAM HCL 2 MG/2ML IJ SOLN
INTRAMUSCULAR | Status: AC
  Filled 2015-04-11: qty 6

## 2015-04-11 MED ORDER — MIDAZOLAM HCL 2 MG/2ML IJ SOLN
INTRAMUSCULAR | Status: AC | PRN
  Administered 2015-04-11 (×2): 0.5 mg via INTRAVENOUS
  Administered 2015-04-11: 1 mg via INTRAVENOUS
  Administered 2015-04-11 (×2): 0.5 mg via INTRAVENOUS

## 2015-04-11 MED ORDER — ONDANSETRON HCL 4 MG/2ML IJ SOLN
8.0000 mg | INTRAMUSCULAR | Status: AC
  Administered 2015-04-11: 8 mg via INTRAVENOUS

## 2015-04-11 MED ORDER — VERAPAMIL HCL 2.5 MG/ML IV SOLN
INTRAVENOUS | Status: AC
  Filled 2015-04-11: qty 2

## 2015-04-11 MED ORDER — CIPROFLOXACIN IN D5W 400 MG/200ML IV SOLN
400.0000 mg | INTRAVENOUS | Status: AC
  Administered 2015-04-11: 400 mg via INTRAVENOUS

## 2015-04-11 MED ORDER — SODIUM CHLORIDE 0.9 % IV SOLN
INTRAVENOUS | Status: DC
  Administered 2015-04-11 – 2015-04-12 (×4): via INTRAVENOUS

## 2015-04-11 MED ORDER — PANTOPRAZOLE SODIUM 40 MG PO TBEC
40.0000 mg | DELAYED_RELEASE_TABLET | Freq: Every day | ORAL | Status: DC
  Administered 2015-04-11 – 2015-04-12 (×2): 40 mg via ORAL
  Filled 2015-04-11 (×2): qty 1

## 2015-04-11 MED ORDER — FENTANYL CITRATE (PF) 100 MCG/2ML IJ SOLN
INTRAMUSCULAR | Status: AC
  Filled 2015-04-11: qty 4

## 2015-04-11 MED ORDER — OXYCODONE HCL 5 MG PO TABS
5.0000 mg | ORAL_TABLET | Freq: Four times a day (QID) | ORAL | Status: DC | PRN
  Administered 2015-04-12: 10 mg via ORAL
  Filled 2015-04-11: qty 2

## 2015-04-11 MED ORDER — CIPROFLOXACIN IN D5W 400 MG/200ML IV SOLN
INTRAVENOUS | Status: AC
  Administered 2015-04-11: 400 mg via INTRAVENOUS
  Filled 2015-04-11: qty 200

## 2015-04-11 NOTE — Sedation Documentation (Signed)
Patient denies pain and is resting comfortably.  

## 2015-04-11 NOTE — Sedation Documentation (Signed)
5 Fr sheath removed from L femoral artery. Hemostasis achieved using Exoseal closure device.  Groin level 0, L DP + Doppler.

## 2015-04-11 NOTE — H&P (Signed)
Referring Physician(s): Ennever,Peter  Supervising Physician: Jacqulynn Cadet  Chief Complaint:  Hepatocellular carcinoma  Subjective: Pt familiar to IR service from prior hepatic DEB- TACE for [facility] on 10/24/14. He has known history of HCV cirrhosis with recent MRI on 03/18/15 demonstrating some slight increase in peripheral nodular enhancement at the superior aspect of the previously treated segment 1 hepatic lesion. In addition there is 1 new nonspecific hyperenhancing foci in the right liver which needs to be followed on subsequent imaging. He presents again today for repeat hepatic DEB-TACE  to treat residual disease. He currently denies fever, headache, chest pain, nausea, vomiting or abnormal bleeding. He does have some dyspnea with exertion, occasional cough, mild generalized abdominal and chronic low back pain.    Allergies: Penicillins; Tylenol; and Codeine  Medications: Prior to Admission medications   Medication Sig Start Date End Date Taking? Authorizing Provider  folic acid (FOLVITE) 1 MG tablet Take 1 tablet (1 mg total) by mouth daily. 12/14/14  Yes Volanda Napoleon, MD  furosemide (LASIX) 40 MG tablet Take 1 tablet (40 mg total) by mouth daily. 09/09/14  Yes Volanda Napoleon, MD  oxyCODONE (OXYCONTIN) 20 mg 12 hr tablet Take 1 tablet (20 mg total) by mouth every 12 (twelve) hours. 03/27/15  Yes Volanda Napoleon, MD  oxyCODONE (ROXICODONE) 5 MG immediate release tablet Take 1-2 tablets (5-10 mg total) by mouth every 6 (six) hours as needed for severe pain. 03/24/15  Yes Volanda Napoleon, MD  pantoprazole (PROTONIX) 40 MG tablet Take 1 tablet (40 mg total) by mouth daily. 12/14/14  Yes Volanda Napoleon, MD  spironolactone (ALDACTONE) 50 MG tablet Take 1 tablet (50 mg total) by mouth daily. 01/13/15  Yes Volanda Napoleon, MD  fentaNYL (DURAGESIC - DOSED MCG/HR) 25 MCG/HR patch Place 1 patch (25 mcg total) onto the skin every 3 (three) days. 02/01/15   Volanda Napoleon, MD  Oxycodone  HCl 10 MG TABS  02/02/15   Historical Provider, MD  propranolol (INDERAL) 10 MG tablet Take 1 tablet (10 mg total) by mouth 2 (two) times daily. 03/08/15   Mackie Pai, PA-C     Vital Signs: BP 114/72 mmHg  Pulse 58  Temp(Src) 97.5 F (36.4 C) (Oral)  Resp 18  SpO2 100%  Physical Exam patient awake, alert. Chest clear to auscultation bilaterally. Heart with regular rate and rhythm. Abdomen soft, mild dist,  positive bowel sounds, splenomegaly, mild generalized tenderness to palpation; lower extremities with no significant edema.  Imaging: No results found.  Labs:  CBC:  Recent Labs  10/27/14 0950 11/22/14 0001 03/08/15 1504 03/13/15 1405  WBC 5.1 5.5 4.7 3.7*  HGB 10.2* 10.0* 10.6* 10.9*  HCT 30.2* 29.2* 31.5* 32.5*  PLT 86* 177 124.0* 90*    COAGS:  Recent Labs  10/27/14 0950 11/22/14 0001  INR 1.16 1.22  APTT 34  --     BMP:  Recent Labs  10/27/14 0950 10/28/14 0545 11/22/14 0001 03/08/15 1504 03/13/15 1405  NA 139 138 133* 136 141  K 3.9 4.4 4.6 4.5 3.7  CL 105 109 101 107 109  CO2 25 23 25 25  20*  GLUCOSE 115* 147* 132* 94 211*  BUN 37* 37* 26* 22 25*  CALCIUM 10.8* 9.8 10.7* 10.7* 10.5*  CREATININE 1.68* 1.71* 1.41* 1.19 1.31*  GFRNONAA 41* 40* 52*  --  55*  GFRAA 47* 46* 60  --  >60    LIVER FUNCTION TESTS:  Recent Labs  10/28/14  [id number] 11/22/14 0001 03/08/15 1504 03/13/15 1405  BILITOT 0.5 1.0 1.1 1.5*  AST 91* 17 23 36  ALT 27 9 14 20   ALKPHOS 60 93 87 92  PROT 6.8 7.2 7.4 7.5  ALBUMIN 3.2* 3.1* 3.4* 3.3*    Assessment and Plan: Patient with history of HCV cirrhosis and HCC, status post prior hepatic DEB-TACE in 09/2014. Recent follow-up MRI abdomen performed on 03/18/15 reveals some interval growth of the nodular focus of viable tumor at the periphery of the treated caudate lobe [place] with a new indeterminate 0.7 cm nodular focus in segment 5 of the right liver lobe. Patient was seen recently in follow-up by Dr. Laurence Ferrari and is now  scheduled for repeat hepatic DEB- TACE to treat residual disease. Details/risks of procedure, including but not limited to, internal bleeding, infection, contrast nephropathy, nontarget embolization, discussed with patient with his understanding and consent. Following procedure patient will be observed overnight in the hospital.   Electronically Signed: D. Rowe Robert 04/11/2015, 12:56 PM   I spent a total of 30 minutes at the the patient's bedside AND on the patient's hospital floor or unit, greater than 50% of which was counseling/coordinating care for hepatic DEB-TACE

## 2015-04-11 NOTE — Procedures (Signed)
Interventional Radiology Procedure Note  Procedure: DEB-TACE  Access: LEFT femoral artery, 46F.    Complications: None  Estimated Blood Loss: < 25 mL  Recommendations:   -Bedrest x 4 hrs with elg straight - ADAT - Labs in am  Signed,  Criselda Peaches, MD

## 2015-04-11 NOTE — Sedation Documentation (Signed)
Gauze/tegaderm bandage applied to L fem artery puncture.  Groin level 0, L RDP +Doppler.

## 2015-04-12 DIAGNOSIS — C220 Liver cell carcinoma: Secondary | ICD-10-CM | POA: Diagnosis not present

## 2015-04-12 LAB — COMPREHENSIVE METABOLIC PANEL
ALBUMIN: 3.2 g/dL — AB (ref 3.5–5.0)
ALK PHOS: 80 U/L (ref 38–126)
ALT: 16 U/L — AB (ref 17–63)
ANION GAP: 10 (ref 5–15)
AST: 22 U/L (ref 15–41)
BUN: 28 mg/dL — ABNORMAL HIGH (ref 6–20)
CALCIUM: 10 mg/dL (ref 8.9–10.3)
CHLORIDE: 109 mmol/L (ref 101–111)
CO2: 20 mmol/L — AB (ref 22–32)
Creatinine, Ser: 1.57 mg/dL — ABNORMAL HIGH (ref 0.61–1.24)
GFR calc Af Amer: 51 mL/min — ABNORMAL LOW (ref >60)
GFR calc non Af Amer: 44 mL/min — ABNORMAL LOW (ref >60)
GLUCOSE: 206 mg/dL — AB (ref 65–99)
Potassium: 4.3 mmol/L (ref 3.5–5.1)
SODIUM: 139 mmol/L (ref 135–145)
Total Bilirubin: 1 mg/dL (ref 0.3–1.2)
Total Protein: 7 g/dL (ref 6.5–8.1)

## 2015-04-12 LAB — CBC
HEMATOCRIT: 27.9 % — AB (ref 39.0–52.0)
Hemoglobin: 9.6 g/dL — ABNORMAL LOW (ref 13.0–17.0)
MCH: 31.5 pg (ref 26.0–34.0)
MCHC: 34.4 g/dL (ref 30.0–36.0)
MCV: 91.5 fL (ref 78.0–100.0)
PLATELETS: 69 10*3/uL — AB (ref 150–400)
RBC: 3.05 MIL/uL — ABNORMAL LOW (ref 4.22–5.81)
RDW: 14.4 % (ref 11.5–15.5)
WBC: 5.3 10*3/uL (ref 4.0–10.5)

## 2015-04-12 MED ORDER — OXYCODONE HCL 5 MG PO TABS: 5.0000 mg | ORAL_TABLET | Freq: Four times a day (QID) | ORAL | Status: DC | PRN

## 2015-04-12 MED ORDER — CIPROFLOXACIN HCL 500 MG PO TABS: 500.0000 mg | ORAL_TABLET | Freq: Two times a day (BID) | ORAL | Status: DC

## 2015-04-12 MED ORDER — ONDANSETRON HCL 8 MG PO TABS: 8.0000 mg | ORAL_TABLET | Freq: Three times a day (TID) | ORAL | Status: DC | PRN

## 2015-04-12 MED ORDER — DOCUSATE SODIUM 100 MG PO CAPS: 100.0000 mg | ORAL_CAPSULE | Freq: Two times a day (BID) | ORAL | Status: DC

## 2015-04-12 NOTE — Progress Notes (Signed)
Left groin dressing dry and intact, no bleeding , hematoma noted, feet warm to touch, pedal pulses present.

## 2015-04-12 NOTE — Discharge Summary (Signed)
Patient ID: Antonio Fleming MRN: [id number] DOB/AGE: 1950-06-28 65 y.o.  Admit date: 04/11/2015 Discharge date: 04/12/2015  Supervising Physician: Jacqulynn Cadet  Admission Diagnoses:  1. Hepatocellular carcinoma  Discharge Diagnoses:  Active Problems:   Hepatocellular carcinoma [facility])   Discharged Condition: good  Hospital Course: The patient was admitted for observation following his DEB-TACE procedure.  The patient has had minimal pain, but does have a fentanyl patch that he chronically wears as well.  He had minimal nausea and has been able to tolerate oral intake.  His labs are stable and he is otherwise stable on POD 1, for DC home.  Consults: None  Treatments: IV hydration, antibiotics: Cipro, analgesia: oxycodone and procedures: DEB-TACE  Discharge Exam: Blood pressure 117/53, pulse 51, temperature 98.2 F (36.8 C), temperature source Oral, resp. rate 16, height 5\' 6"  (1.676 m), weight 125 lb 6.4 oz (56.881 kg), SpO2 100 %. General appearance: alert and cooperative Resp: clear to auscultation bilaterally Cardio: regular rate and rhythm GI: soft, non-tender; bowel sounds normal; no masses,  no organomegaly left wrist and left groin site are c/d/i with no evidence of bleeding or infection.  Disposition: 01-Home or Self Care     Medication List    TAKE these medications        ciprofloxacin 500 MG tablet  Commonly known as:  CIPRO  Take 1 tablet (500 mg total) by mouth 2 (two) times daily.     docusate sodium 100 MG capsule  Commonly known as:  COLACE  Take 1 capsule (100 mg total) by mouth 2 (two) times daily.     fentaNYL 25 MCG/HR patch  Commonly known as:  DURAGESIC - dosed mcg/hr  Place 1 patch (25 mcg total) onto the skin every 3 (three) days.     folic acid 1 MG tablet  Commonly known as:  FOLVITE  Take 1 tablet (1 mg total) by mouth daily.     furosemide 40 MG tablet  Commonly known as:  LASIX  Take 1 tablet (40 mg total) by mouth daily.     ondansetron 8 MG tablet  Commonly known as:  ZOFRAN  Take 1 tablet (8 mg total) by mouth every 8 (eight) hours as needed for nausea or vomiting.     oxyCODONE 5 MG immediate release tablet  Commonly known as:  ROXICODONE  Take 1-2 tablets (5-10 mg total) by mouth every 6 (six) hours as needed for severe pain.     oxyCODONE 20 mg 12 hr tablet  Commonly known as:  OXYCONTIN  Take 1 tablet (20 mg total) by mouth every 12 (twelve) hours.     oxyCODONE 5 MG immediate release tablet  Commonly known as:  ROXICODONE  Take 1 tablet (5 mg total) by mouth every 6 (six) hours as needed for severe pain.     pantoprazole 40 MG tablet  Commonly known as:  PROTONIX  Take 1 tablet (40 mg total) by mouth daily.     propranolol 10 MG tablet  Commonly known as:  INDERAL  Take 1 tablet (10 mg total) by mouth 2 (two) times daily.     spironolactone 50 MG tablet  Commonly known as:  ALDACTONE  Take 1 tablet (50 mg total) by mouth daily.           Follow-up Information    Follow up with [facility], HEATH, MD In 2 weeks.   Specialties:  Interventional Radiology, Radiology   Why:  our office will call you with appointment time  Contact information:   [place] [address] [phone]        Electronically Signed: Henreitta Cea 04/12/2015, 12:36 PM   I have spent Less Than 30 Minutes discharging Antonio Fleming.

## 2015-04-12 NOTE — Discharge Instructions (Signed)
Chemoembolization, Care After  Refer to this sheet in the next few weeks. These instructions provide you with information on caring for yourself after your procedure. Your health care provider may also give you more specific instructions. Your treatment has been planned according to current medical practices, but problems sometimes occur. Call your health care provider if you have any problems or questions after your procedure.  WHAT TO EXPECT AFTER THE PROCEDURE   After your procedure, it is typical to have the following:   You might have a slight fever for 1-2 weeks after the procedure. If it gets worse, let your health care provider know.   You might feel tired and not hungry. This is normal. These feelings should go away in about 1 week.  HOME CARE INSTRUCTIONS   Take any medicine your health care provider prescribed for pain, nausea, or fever. Follow the directions carefully.   Ask your health care provider whether you can take over-the-counter medicines for pain or fever. Do not take aspirin or anti-inflammatory medicines such as ibuprofen or naproxen unless your health care provider says that you should. These medicines can increase the chances of bleeding.   If you were given a small breathing device (incentive spirometer), be sure to use it. It helps keep your lungs clear while you are recovering. You will not need this after your activity level is back to normal.   Do not get the puncture site wet for the first few days after surgery or until your health care provider says it is okay.   You should be able to resume your normal routine in about 1 week.   During the first month after your procedure, you will probably need to go back to your health care provider for some simple tests. Scans and blood tests will help determine whether the procedure worked.  SEEK MEDICAL CARE IF:   Blood or fluid leaks from the wound, or the wound becomes red or swollen.   You become nauseous or throw up for more than  2 days after surgery.   Your pain or fever becomes worse than it was when you left the hospital.   You cannot drink clear liquids such as water or diluted juice or tea 24 hours after your procedure.   You develop a rash.  SEEK IMMEDIATE MEDICAL CARE IF:   You have a fever that gets worse or does not go away after 1 week.   You develop pain, swelling, or discoloration in your legs.   Your legs become pale, cold, or blue.   You develop shortness of breath, feel faint, or pass out.   You have chest pain.   You have weakness or difficulty moving your arms or legs.   You have changes in your speech or vision.     This information is not intended to replace advice given to you by your health care provider. Make sure you discuss any questions you have with your health care provider.     Document Released: 09/12/2010 Document Revised: 11/04/2012 Document Reviewed: 09/21/2012  Elsevier Interactive Patient Education 2016 [employer]

## 2015-04-14 ENCOUNTER — Other Ambulatory Visit: Payer: Self-pay | Admitting: *Deleted

## 2015-04-14 DIAGNOSIS — C220 Liver cell carcinoma: Secondary | ICD-10-CM

## 2015-04-27 ENCOUNTER — Other Ambulatory Visit (HOSPITAL_BASED_OUTPATIENT_CLINIC_OR_DEPARTMENT_OTHER): Payer: Medicare HMO

## 2015-04-27 ENCOUNTER — Ambulatory Visit (HOSPITAL_BASED_OUTPATIENT_CLINIC_OR_DEPARTMENT_OTHER): Payer: Medicare HMO

## 2015-04-27 ENCOUNTER — Encounter: Payer: Self-pay | Admitting: Hematology & Oncology

## 2015-04-27 ENCOUNTER — Other Ambulatory Visit: Payer: Self-pay | Admitting: *Deleted

## 2015-04-27 ENCOUNTER — Ambulatory Visit (HOSPITAL_BASED_OUTPATIENT_CLINIC_OR_DEPARTMENT_OTHER): Payer: Medicare HMO | Admitting: Hematology & Oncology

## 2015-04-27 VITALS — BP 103/61 | HR 62 | Temp 97.4°F | Resp 16 | Ht 66.0 in | Wt 121.0 lb

## 2015-04-27 DIAGNOSIS — C220 Liver cell carcinoma: Secondary | ICD-10-CM

## 2015-04-27 DIAGNOSIS — G894 Chronic pain syndrome: Secondary | ICD-10-CM | POA: Diagnosis not present

## 2015-04-27 DIAGNOSIS — Z720 Tobacco use: Secondary | ICD-10-CM

## 2015-04-27 DIAGNOSIS — G4701 Insomnia due to medical condition: Secondary | ICD-10-CM

## 2015-04-27 LAB — CBC WITH DIFFERENTIAL (CANCER CENTER ONLY)
BASO#: 0 10*3/uL (ref 0.0–0.2)
BASO%: 0.4 % (ref 0.0–2.0)
EOS%: 5.7 % (ref 0.0–7.0)
Eosinophils Absolute: 0.3 10*3/uL (ref 0.0–0.5)
HEMATOCRIT: 28.9 % — AB (ref 38.7–49.9)
HEMOGLOBIN: 10.1 g/dL — AB (ref 13.0–17.1)
LYMPH#: 0.6 10*3/uL — AB (ref 0.9–3.3)
LYMPH%: 10.8 % — ABNORMAL LOW (ref 14.0–48.0)
MCH: 32.8 pg (ref 28.0–33.4)
MCHC: 34.9 g/dL (ref 32.0–35.9)
MCV: 94 fL (ref 82–98)
MONO#: 0.5 10*3/uL (ref 0.1–0.9)
MONO%: 9.9 % (ref 0.0–13.0)
NEUT%: 73.2 % (ref 40.0–80.0)
NEUTROS ABS: 4 10*3/uL (ref 1.5–6.5)
Platelets: 147 10*3/uL (ref 145–400)
RBC: 3.08 10*6/uL — ABNORMAL LOW (ref 4.20–5.70)
RDW: 14.9 % (ref 11.1–15.7)
WBC: 5.5 10*3/uL (ref 4.0–10.0)

## 2015-04-27 LAB — COMPREHENSIVE METABOLIC PANEL
ALBUMIN: 3 g/dL — AB (ref 3.5–5.0)
ALK PHOS: 166 U/L — AB (ref 40–150)
ALT: 22 U/L (ref 0–55)
AST: 21 U/L (ref 5–34)
Anion Gap: 7 mEq/L (ref 3–11)
BUN: 22.8 mg/dL (ref 7.0–26.0)
CALCIUM: 10.5 mg/dL — AB (ref 8.4–10.4)
CO2: 22 mEq/L (ref 22–29)
CREATININE: 1.4 mg/dL — AB (ref 0.7–1.3)
Chloride: 107 mEq/L (ref 98–109)
EGFR: 54 mL/min/{1.73_m2} — ABNORMAL LOW (ref >90)
Glucose: 168 mg/dl — ABNORMAL HIGH (ref 70–140)
Potassium: 4.3 mEq/L (ref 3.5–5.1)
Sodium: 136 mEq/L (ref 136–145)
Total Bilirubin: 1.66 mg/dL — ABNORMAL HIGH (ref 0.20–1.20)
Total Protein: 7.8 g/dL (ref 6.4–8.3)

## 2015-04-27 LAB — PROTIME-INR (CHCC SATELLITE)
INR: 1.2 — ABNORMAL LOW (ref 2.0–3.5)
PROTIME: 14.4 s — AB (ref 10.6–13.4)

## 2015-04-27 MED ORDER — OXYCODONE HCL ER 20 MG PO T12A: 20.0000 mg | EXTENDED_RELEASE_TABLET | Freq: Two times a day (BID) | ORAL | Status: DC

## 2015-04-27 MED ORDER — TRAZODONE HCL 50 MG PO TABS: 50.0000 mg | ORAL_TABLET | Freq: Every day | ORAL | Status: DC

## 2015-04-27 MED ORDER — HYDROMORPHONE HCL 1 MG/ML IJ SOLN
INTRAMUSCULAR | Status: AC
Start: 2015-04-27 — End: 2015-04-27
  Filled 2015-04-27: qty 1

## 2015-04-27 MED ORDER — OXYCODONE HCL 5 MG PO TABS: 5.0000 mg | ORAL_TABLET | Freq: Four times a day (QID) | ORAL | Status: DC | PRN

## 2015-04-27 MED ORDER — HYDROMORPHONE HCL 1 MG/ML IJ SOLN
1.0000 mg | INTRAMUSCULAR | Status: DC | PRN
  Administered 2015-04-27: 1 mg via INTRAVENOUS

## 2015-04-27 MED ORDER — SODIUM CHLORIDE 0.9 % IV SOLN
INTRAVENOUS | Status: AC
  Administered 2015-04-27: 15:00:00 via INTRAVENOUS

## 2015-04-27 MED ORDER — SODIUM CHLORIDE 0.9 % IV SOLN: INTRAVENOUS | Status: DC

## 2015-04-27 MED FILL — oxyCODONE HCL ER 20 MG T12A: 20 | 30 days supply | Qty: 60 | Fill #0

## 2015-04-27 MED FILL — oxyCODONE HCL 5 MG TABS: 5 | 15 days supply | Qty: 120 | Fill #0

## 2015-04-27 MED FILL — traZODone HCL 50 MG TABS: 50 | 30 days supply | Qty: 30 | Fill #0

## 2015-04-27 NOTE — Patient Instructions (Signed)

## 2015-04-27 NOTE — Progress Notes (Signed)
  Hematology and Oncology Follow Up Visit  Antonio Fleming [id number] Jul 14, 1950 65 y.o. 04/27/2015   Principle Diagnosis:  Hepatocellular carcinoma Hepatitis C with cirrhosis/ascites  Current Therapy:   Observation S/p 2nd chemo-embolization with drug eluting beads - 04/11/2015    Interim History:  [name] is here today for a follow-up. He actually underwent a second chemoembolization. This was done in March couple weeks ago. He was found to have his initial tumor to be enlarging. Interventional radiology did a fantastic job with him.  He tolerated this procedure better than the first one that he had.  He still having some abdominal discomfort. He's not eating all that much. He looks dehydrated.  His weight is down somewhat.  He's had no cough or shortness of breath. He's had no leg swelling.  He's not had any bleeding. There's been no diarrhea.  Overall, I would say that his performance status is ECOG 2-3.  He is not sleeping all that well. We'll see about some trazodone for him.  Medications:   Allergies:    Past Medical History, Surgical history, Social history, and Family History were reviewed and updated.  Review of Systems: All other 10 point review of systems is negative.   Physical Exam:  height is 5\' 6"  (1.676 m) and weight is 121 lb (54.885 kg). His oral temperature is 97.4 F (36.3 C). His blood pressure is 103/61 and his pulse is 62. His respiration is 16.   Wt Readings from Last 3 Encounters:  04/27/15 121 lb (54.885 kg)  04/12/15 125 lb 6.4 oz (56.881 kg)  03/21/15 126 lb (57.153 kg)    Ocular: Sclerae unicteric, pupils equal, round and reactive to light Ear-nose-throat: Oropharynx clear, dentition fair Lymphatic: No cervical supraclavicular or adenopathy Lungs no rales or rhonchi, good excursion bilaterally Heart regular rate and rhythm, no murmur appreciated Abd soft, tender in both left and right lower quadrants, no fluid wave, no liver or spleen  tip palpated on exam, positive bowel sounds MSK no focal spinal tenderness, no joint edema Neuro: non-focal, well-oriented, appropriate affect Breasts: Deferred    Impression and Plan: [name] is 65 yo white male with hepatocellular carcinoma and history of Hep C. He was treated with chemo embolization with drug eluting beads.  It is too early to tell how well this second procedure work.  I do believe that he needs some IV fluids. We will go ahead and give him some IV fluids. He is dehydrated. I don't think he is drinking that much area did he deftly is not eating that much.  We need to refill some of his medications. He ran out of his pain medicine couple weeks ago. He's not been able to afford anything.  He is so nice. I just feel bad for him. I does wish that he would have a better quality of life.  I will like to see him back in a couple weeks so we can monitor him closely.  I spent about 30 minutes with him today. Volanda Napoleon, MD 3/30/20172:42 PM

## 2015-04-28 ENCOUNTER — Encounter: Payer: Self-pay | Admitting: Hematology & Oncology

## 2015-04-28 DIAGNOSIS — C220 Liver cell carcinoma: Secondary | ICD-10-CM

## 2015-04-28 HISTORY — DX: Liver cell carcinoma: C22.0

## 2015-04-28 LAB — IRON AND TIBC
%SAT: 24 % (ref 20–55)
Iron: 58 ug/dL (ref 42–163)
TIBC: 238 ug/dL (ref 202–409)
UIBC: 180 ug/dL (ref 117–376)

## 2015-04-28 LAB — FERRITIN: FERRITIN: 254 ng/mL (ref 22–316)

## 2015-05-10 ENCOUNTER — Other Ambulatory Visit: Payer: Medicare HMO

## 2015-05-11 ENCOUNTER — Ambulatory Visit
Admission: RE | Admit: 2015-05-11 | Discharge: 2015-05-11 | Disposition: A | Discharge status: Home / Self Care | Payer: Medicare HMO | Source: Ambulatory Visit | Attending: General Surgery | Admitting: General Surgery

## 2015-05-11 ENCOUNTER — Other Ambulatory Visit (HOSPITAL_COMMUNITY): Payer: Self-pay | Admitting: Interventional Radiology

## 2015-05-11 DIAGNOSIS — C220 Liver cell carcinoma: Secondary | ICD-10-CM

## 2015-05-11 NOTE — Progress Notes (Signed)
Chief Complaint: Patient was seen in follow-up today for  Chief Complaint  Patient presents with  . Follow-up    4 wk follow up DEB-TACE    at the request of Osborne,Kelly  Referring Physician(s): Osborne,Kelly  History of Present Illness: Antonio Fleming is a 65 y.o. male with hepatocellular carcinoma status post treatment with chemo0embolization (drug-eluting beads) initially in September 2016. He had a good initial response to therapy with some residual peripheral nodular enhancement in the tumor bed. Therefore, repeat chemoembolization was performed on 04/11/2015.  He presents today for his first postoperative evaluation. He did well and his postoperative course. His symptoms were less significant this time compared to the initial treatment. He is currently free from abdominal pain, nausea and vomiting. No fever or chills. He does have persistently decreased appetite and fatigue. He often sleeps until nearly 2:00 in the afternoon. He remains in good spirits.  He feels that his stomach may have shrunk from not eating very much over a long period of time. He is able to eat approximately a third of what he used to consume and then feels quite full afterward. Taste seems to be preserved.  He continues to smoke as he has since he was 65 years old. Additionally, he continues to drink beer, mostly on Sundays when he watches NASCAR.  Past Medical History  Diagnosis Date  . Cirrhosis ([place])   . Renal disorder   . GI bleed   . Anxiety   . Arthritis   . Blood transfusion without reported diagnosis   . Cancer ([place])   . Cataract   . Depression   . GERD (gastroesophageal reflux disease)   . Hypertension   . Substance abuse   . Tuberculosis   . Absolute anemia 09/26/2008  . Recurrent hepatocellular carcinoma ([place]) 04/28/2015    Past Surgical History  Procedure Laterality Date  . Abdominal surgery      band around gut at [facility]  . Throat surgery    . Eye surgery  2013     Bilateral cataract surgery      Allergies: Penicillins; Tylenol; and Codeine  Medications: Prior to Admission medications   Medication Sig Start Date End Date Taking? Authorizing Provider  docusate sodium (COLACE) 100 MG capsule Take 1 capsule (100 mg total) by mouth 2 (two) times daily. 04/12/15  Yes Saverio Danker, PA-C  folic acid (FOLVITE) 1 MG tablet Take 1 tablet (1 mg total) by mouth daily. 12/14/14  Yes Volanda Napoleon, MD  furosemide (LASIX) 40 MG tablet Take 1 tablet (40 mg total) by mouth daily. 09/09/14  Yes Volanda Napoleon, MD  oxyCODONE (OXYCONTIN) 20 mg 12 hr tablet Take 1 tablet (20 mg total) by mouth every 12 (twelve) hours. 04/27/15  Yes Volanda Napoleon, MD  oxyCODONE (ROXICODONE) 5 MG immediate release tablet Take 1-2 tablets (5-10 mg total) by mouth every 6 (six) hours as needed for severe pain. 04/27/15  Yes Volanda Napoleon, MD  pantoprazole (PROTONIX) 40 MG tablet Take 1 tablet (40 mg total) by mouth daily. 12/14/14  Yes Volanda Napoleon, MD  propranolol (INDERAL) 10 MG tablet Take 1 tablet (10 mg total) by mouth 2 (two) times daily. 03/08/15  Yes Edward Saguier, PA-C  spironolactone (ALDACTONE) 50 MG tablet Take 1 tablet (50 mg total) by mouth daily. 01/13/15  Yes Volanda Napoleon, MD  traZODone (DESYREL) 50 MG tablet Take 1 tablet (50 mg total) by mouth at bedtime. 04/27/15  Yes Rudell Cobb  Ennever, MD  ondansetron (ZOFRAN) 8 MG tablet Take 1 tablet (8 mg total) by mouth every 8 (eight) hours as needed for nausea or vomiting. Patient not taking: Reported on 05/11/2015 04/12/15   Saverio Danker, PA-C     Family History  Problem Relation Age of Onset  . Heart disease Mother   . Cancer Father     esophagus cancer  . Cancer Brother   . Heart disease Brother     triple bypass   . Cancer Brother     Social History   Social History  . Marital Status: Divorced    Spouse Name: N/A  . Number of Children: N/A  . Years of Education: N/A   Social History Main Topics  . Smoking  status: Current Every Day Smoker -- 1.00 packs/day for 49 years    Types: Cigarettes    Start date: 03/20/1973  . Smokeless tobacco: Never Used     Comment: 08-18-14  still smoking  . Alcohol Use: 0.0 oz/week    0 Standard drinks or equivalent per week  . Drug Use: No  . Sexual Activity: Not on file   Other Topics Concern  . Not on file   Social History Narrative    ECOG Status: 2 - Symptomatic, <50% confined to bed  Review of Systems: A 12 point ROS discussed and pertinent positives are indicated in the HPI above.  All other systems are negative.  Review of Systems  Vital Signs: BP 95/64 mmHg  Pulse 51  Temp(Src) 97.8 F (36.6 C) (Oral)  Resp 15  Ht 5\' 6"  (1.676 m)  Wt 122 lb (55.339 kg)  BMI 19.70 kg/m2  SpO2 99%  Physical Exam  Constitutional: He is oriented to person, place, and time. Vital signs are normal. He appears well-developed. He appears cachectic. No distress.  HENT:  Head: Normocephalic and atraumatic.  Eyes: No scleral icterus.  Cardiovascular: Normal rate and regular rhythm.   Pulmonary/Chest: Effort normal.  Abdominal: Soft. He exhibits no distension. There is no tenderness.  Neurological: He is alert and oriented to person, place, and time.  Skin: Skin is warm and dry.  Psychiatric: He has a normal mood and affect. His behavior is normal.  Nursing note and vitals reviewed.    Imaging:   Labs:  CBC:  Recent Labs  03/13/15 1405 04/11/15 1244 04/12/15 0353 04/27/15 1256  WBC 3.7* 5.2 5.3 5.5  HGB 10.9* 10.3* 9.6* 10.1*  HCT 32.5* 30.3* 27.9* 28.9*  PLT 90* 84* 69* 147    COAGS:  Recent Labs  10/27/14 0950 11/22/14 0001 04/11/15 1244 04/27/15 1322  INR 1.16 1.22 1.26 1.2*  APTT 34  --   --   --     BMP:  Recent Labs  11/22/14 0001 03/08/15 1504 03/13/15 1405 04/11/15 1244 04/12/15 0353 04/27/15 1256  NA 133* 136 141 139 139 136  K 4.6 4.5 3.7 4.5 4.3 4.3  CL 101 107 109 108 109  --   CO2 25 25 20* 24 20* 22    GLUCOSE 132* 94 211* 134* 206* 168*  BUN 26* 22 25* 32* 28* 22.8  CALCIUM 10.7* 10.7* 10.5* 10.6* 10.0 10.5*  CREATININE 1.41* 1.19 1.31* 1.52* 1.57* 1.4*  GFRNONAA 52*  --  55* 46* 44*  --   GFRAA 60  --  >60 53* 51*  --     LIVER FUNCTION TESTS:  Recent Labs  03/13/15 1405 04/11/15 1244 04/12/15 0353 04/27/15 1256  BILITOT 1.5* 1.2 1.0  1.66*  AST 36 23 22 21   ALT 20 15* 16* 22  ALKPHOS 92 90 80 166*  PROT 7.5 7.3 7.0 7.8  ALBUMIN 3.3* 3.3* 3.2* 3.0*    TUMOR MARKERS:  Recent Labs  08/18/14 1506  AFPTM 1.7    Assessment and Plan:  Doing well post second chemoembolization with drug-eluting beads. His recover was easier following this treatment session.  He continues to struggle with fatigue, poor appetite and energy.  1.) I issued him a prescription for Megace 800 mg daily. Hopefully this appetite stimulant will be helpful. 2.) MRI of the abdomen with gadolinium contrast in the next 2-4 weeks. This scan can be performed at [facility] [place] [facility]. Following his MRI, I will see him again in clinic to discuss the results.  Thank you for this interesting consult.  I greatly enjoyed meeting Jaethan Duel and look forward to participating in their care.  A copy of this report was sent to the requesting provider on this date.  Electronically Signed: Jacqulynn Cadet 05/11/2015, 2:26 PM   I spent a total of 15 Minutes in face to face in clinical consultation, greater than 50% of which was counseling/coordinating care for hepatocellular cancer

## 2015-05-24 ENCOUNTER — Other Ambulatory Visit: Payer: Medicare HMO

## 2015-05-24 ENCOUNTER — Ambulatory Visit: Payer: Medicare HMO | Admitting: Hematology & Oncology

## 2015-05-30 ENCOUNTER — Other Ambulatory Visit: Payer: Self-pay | Admitting: *Deleted

## 2015-05-30 DIAGNOSIS — G4701 Insomnia due to medical condition: Secondary | ICD-10-CM

## 2015-05-30 DIAGNOSIS — G894 Chronic pain syndrome: Secondary | ICD-10-CM

## 2015-05-30 DIAGNOSIS — C220 Liver cell carcinoma: Secondary | ICD-10-CM

## 2015-05-30 MED ORDER — OXYCODONE HCL 5 MG PO TABS: 5.0000 mg | ORAL_TABLET | Freq: Four times a day (QID) | ORAL | Status: DC | PRN

## 2015-05-30 MED ORDER — OXYCODONE HCL ER 20 MG PO T12A: 20.0000 mg | EXTENDED_RELEASE_TABLET | Freq: Two times a day (BID) | ORAL | Status: DC

## 2015-05-31 ENCOUNTER — Other Ambulatory Visit: Payer: Self-pay | Admitting: *Deleted

## 2015-05-31 DIAGNOSIS — C220 Liver cell carcinoma: Secondary | ICD-10-CM

## 2015-05-31 DIAGNOSIS — K717 Toxic liver disease with fibrosis and cirrhosis of liver: Secondary | ICD-10-CM

## 2015-05-31 MED ORDER — PANTOPRAZOLE SODIUM 40 MG PO TBEC: 40.0000 mg | DELAYED_RELEASE_TABLET | Freq: Every day | ORAL | Status: DC

## 2015-06-03 ENCOUNTER — Ambulatory Visit (HOSPITAL_BASED_OUTPATIENT_CLINIC_OR_DEPARTMENT_OTHER)
Admission: RE | Admit: 2015-06-03 | Discharge: 2015-06-03 | Disposition: A | Discharge status: Home / Self Care | Payer: Medicare HMO | Source: Ambulatory Visit | Attending: Interventional Radiology | Admitting: Interventional Radiology

## 2015-06-03 DIAGNOSIS — N281 Cyst of kidney, acquired: Secondary | ICD-10-CM | POA: Insufficient documentation

## 2015-06-03 DIAGNOSIS — K8070 Calculus of gallbladder and bile duct without cholecystitis without obstruction: Secondary | ICD-10-CM | POA: Diagnosis not present

## 2015-06-03 DIAGNOSIS — C220 Liver cell carcinoma: Secondary | ICD-10-CM | POA: Insufficient documentation

## 2015-06-03 DIAGNOSIS — R161 Splenomegaly, not elsewhere classified: Secondary | ICD-10-CM | POA: Insufficient documentation

## 2015-06-03 MED ORDER — GADOBENATE DIMEGLUMINE 529 MG/ML IV SOLN: 11.0000 mL | Freq: Once | INTRAVENOUS | Status: DC | PRN

## 2015-06-08 ENCOUNTER — Other Ambulatory Visit: Payer: Self-pay | Admitting: Family

## 2015-06-08 DIAGNOSIS — C220 Liver cell carcinoma: Secondary | ICD-10-CM

## 2015-06-08 MED ORDER — MORPHINE SULFATE ER 15 MG PO TBCR: 15.0000 mg | EXTENDED_RELEASE_TABLET | Freq: Two times a day (BID) | ORAL | Status: DC

## 2015-06-08 MED FILL — MORPHINE SULF ER 15 MG TAB: 15 | 30 days supply | Qty: 60 | Fill #0

## 2015-06-13 ENCOUNTER — Ambulatory Visit
Admission: RE | Admit: 2015-06-13 | Discharge: 2015-06-13 | Disposition: A | Discharge status: Home / Self Care | Payer: Medicare HMO | Source: Ambulatory Visit | Attending: Interventional Radiology | Admitting: Interventional Radiology

## 2015-06-13 DIAGNOSIS — C220 Liver cell carcinoma: Secondary | ICD-10-CM

## 2015-06-13 HISTORY — PX: IR GENERIC HISTORICAL: IMG1180011

## 2015-06-13 NOTE — Progress Notes (Signed)
Referring Physician(s): Dr. Lattie Haw  Supervising Physician: Jacqulynn Cadet  Patient Status: Outpt  Chief Complaint: Hepatocellular Carcinoma  Subjective: Antonio Fleming is 65 yo male now about 3 months post op repeat DEB-Tace to [facility]. He had initial treatment on 9/29 and had some residual tumors that were treated with the last. He did better after this procedure and has recovered well. Feels better overall. Appetite good, has gained some weight. He's here today for follow up and review of his MRI  Allergies: Penicillins; Tylenol; and Codeine  Medications: Prior to Admission medications   Medication Sig Start Date End Date Taking? Authorizing Provider  docusate sodium (COLACE) 100 MG capsule Take 1 capsule (100 mg total) by mouth 2 (two) times daily. 04/12/15  Yes Saverio Danker, PA-C  folic acid (FOLVITE) 1 MG tablet Take 1 tablet (1 mg total) by mouth daily. 12/14/14  Yes Volanda Napoleon, MD  furosemide (LASIX) 40 MG tablet Take 1 tablet (40 mg total) by mouth daily. 09/09/14  Yes Volanda Napoleon, MD  morphine (MS CONTIN) 15 MG 12 hr tablet Take 1 tablet (15 mg total) by mouth every 12 (twelve) hours. 06/08/15  Yes Eliezer Bottom, NP  oxyCODONE (ROXICODONE) 5 MG immediate release tablet Take 1-2 tablets (5-10 mg total) by mouth every 6 (six) hours as needed for severe pain. 05/30/15  Yes Volanda Napoleon, MD  pantoprazole (PROTONIX) 40 MG tablet Take 1 tablet (40 mg total) by mouth daily. 05/31/15  Yes Volanda Napoleon, MD  propranolol (INDERAL) 10 MG tablet Take 1 tablet (10 mg total) by mouth 2 (two) times daily. 03/08/15  Yes Edward Saguier, PA-C  spironolactone (ALDACTONE) 50 MG tablet Take 1 tablet (50 mg total) by mouth daily. 01/13/15  Yes Volanda Napoleon, MD  traZODone (DESYREL) 50 MG tablet Take 1 tablet (50 mg total) by mouth at bedtime. 04/27/15  Yes Volanda Napoleon, MD  ondansetron (ZOFRAN) 8 MG tablet Take 1 tablet (8 mg total) by mouth every 8 (eight) hours as needed for  nausea or vomiting. Patient not taking: Reported on 05/11/2015 04/12/15   Saverio Danker, PA-C     Vital Signs: BP 88/60 mmHg  Pulse 62  Temp(Src) 98 F (36.7 C) (Oral)  Resp 15  Ht 5\' 5"  (1.651 m)  Wt 126 lb (57.153 kg)  BMI 20.97 kg/m2  SpO2 100%  Physical Exam NAD Lungs: CTA Heart: reg Abd: soft, NT    Imaging: No results found.  Labs:  CBC:  Recent Labs  03/13/15 1405 04/11/15 1244 04/12/15 0353 04/27/15 1256  WBC 3.7* 5.2 5.3 5.5  HGB 10.9* 10.3* 9.6* 10.1*  HCT 32.5* 30.3* 27.9* 28.9*  PLT 90* 84* 69* 147    COAGS:  Recent Labs  10/27/14 0950 11/22/14 0001 04/11/15 1244 04/27/15 1322  INR 1.16 1.22 1.26 1.2*  APTT 34  --   --   --     BMP:  Recent Labs  11/22/14 0001 03/08/15 1504 03/13/15 1405 04/11/15 1244 04/12/15 0353 04/27/15 1256  NA 133* 136 141 139 139 136  K 4.6 4.5 3.7 4.5 4.3 4.3  CL 101 107 109 108 109  --   CO2 25 25 20* 24 20* 22  GLUCOSE 132* 94 211* 134* 206* 168*  BUN 26* 22 25* 32* 28* 22.8  CALCIUM 10.7* 10.7* 10.5* 10.6* 10.0 10.5*  CREATININE 1.41* 1.19 1.31* 1.52* 1.57* 1.4*  GFRNONAA 52*  --  55* 46* 44*  --   GFRAA  60  --  >60 53* 51*  --     LIVER FUNCTION TESTS:  Recent Labs  03/13/15 1405 04/11/15 1244 04/12/15 0353 04/27/15 1256  BILITOT 1.5* 1.2 1.0 1.66*  AST 36 23 22 21   ALT 20 15* 16* 22  ALKPHOS 92 90 80 166*  PROT 7.5 7.3 7.0 7.8  ALBUMIN 3.3* 3.3* 3.2* 3.0*    Assessment and Plan: HCC S/p 2nd treatment by DEB-TACE Reviewed MRI with pt and discussed follow up plan for 3 month intervals. He is to see Dr. Marin Olp later this week and get some labs as well.  Electronically Signed: Ascencion Dike 06/13/2015, 1:33 PM   I spent a total of 15 Minutes at the the patient's bedside AND on the patient's Fleming floor or unit, greater than 50% of which was counseling/coordinating care for Antonio Fleming treatment

## 2015-06-15 ENCOUNTER — Encounter: Payer: Self-pay | Admitting: Hematology & Oncology

## 2015-06-15 ENCOUNTER — Ambulatory Visit (HOSPITAL_BASED_OUTPATIENT_CLINIC_OR_DEPARTMENT_OTHER): Payer: Medicare HMO | Admitting: Hematology & Oncology

## 2015-06-15 ENCOUNTER — Telehealth: Payer: Self-pay | Admitting: Medical

## 2015-06-15 ENCOUNTER — Other Ambulatory Visit (HOSPITAL_BASED_OUTPATIENT_CLINIC_OR_DEPARTMENT_OTHER): Payer: Medicare HMO

## 2015-06-15 VITALS — BP 89/56 | HR 65 | Temp 97.9°F | Resp 16 | Ht 65.0 in | Wt 123.0 lb

## 2015-06-15 DIAGNOSIS — C220 Liver cell carcinoma: Secondary | ICD-10-CM

## 2015-06-15 DIAGNOSIS — K717 Toxic liver disease with fibrosis and cirrhosis of liver: Secondary | ICD-10-CM | POA: Diagnosis not present

## 2015-06-15 DIAGNOSIS — G894 Chronic pain syndrome: Secondary | ICD-10-CM

## 2015-06-15 DIAGNOSIS — G4701 Insomnia due to medical condition: Secondary | ICD-10-CM

## 2015-06-15 LAB — CBC WITH DIFFERENTIAL (CANCER CENTER ONLY)
BASO#: 0 10*3/uL (ref 0.0–0.2)
BASO%: 0.3 % (ref 0.0–2.0)
EOS%: 18 % — AB (ref 0.0–7.0)
Eosinophils Absolute: 1.2 10*3/uL — ABNORMAL HIGH (ref 0.0–0.5)
HCT: 28.4 % — ABNORMAL LOW (ref 38.7–49.9)
HGB: 9.7 g/dL — ABNORMAL LOW (ref 13.0–17.1)
LYMPH#: 1 10*3/uL (ref 0.9–3.3)
LYMPH%: 15.1 % (ref 14.0–48.0)
MCH: 32.7 pg (ref 28.0–33.4)
MCHC: 34.2 g/dL (ref 32.0–35.9)
MCV: 96 fL (ref 82–98)
MONO#: 0.5 10*3/uL (ref 0.1–0.9)
MONO%: 7.5 % (ref 0.0–13.0)
NEUT#: 4.1 10*3/uL (ref 1.5–6.5)
NEUT%: 59.1 % (ref 40.0–80.0)
PLATELETS: 80 10*3/uL — AB (ref 145–400)
RBC: 2.97 10*6/uL — ABNORMAL LOW (ref 4.20–5.70)
RDW: 15.4 % (ref 11.1–15.7)
WBC: 6.9 10*3/uL (ref 4.0–10.0)

## 2015-06-15 LAB — COMPREHENSIVE METABOLIC PANEL (CC13)
ALT: 16 IU/L (ref 0–44)
AST: 30 IU/L (ref 0–40)
Albumin, Serum: 3.3 g/dL — ABNORMAL LOW (ref 3.6–4.8)
Albumin/Globulin Ratio: 0.8 — ABNORMAL LOW (ref 1.2–2.2)
Alkaline Phosphatase, S: 77 IU/L (ref 39–117)
BUN/Creatinine Ratio: 18 (ref 10–24)
BUN: 23 mg/dL (ref 8–27)
Bilirubin Total: 0.8 mg/dL (ref 0.0–1.2)
CALCIUM: 9.9 mg/dL (ref 8.6–10.2)
CO2: 19 mmol/L (ref 18–29)
CREATININE: 1.25 mg/dL (ref 0.76–1.27)
Chloride, Ser: 101 mmol/L (ref 96–106)
GFR calc Af Amer: 69 mL/min/{1.73_m2} (ref >59)
GFR, EST NON AFRICAN AMERICAN: 60 mL/min/{1.73_m2} (ref >59)
Globulin, Total: 4 g/dL (ref 1.5–4.5)
Glucose: 168 mg/dL — ABNORMAL HIGH (ref 65–99)
Potassium, Ser: 4.3 mmol/L (ref 3.5–5.2)
Sodium: 135 mmol/L (ref 134–144)
Total Protein: 7.3 g/dL (ref 6.0–8.5)

## 2015-06-15 NOTE — Progress Notes (Signed)
Hematology and Oncology Follow Up Visit  Antonio Fleming [id number] 06/29/1950 65 y.o. 06/15/2015   Principle Diagnosis:  Hepatocellular carcinoma Hepatitis C with cirrhosis/ascites  Current Therapy:   Observation S/p 2nd chemo-embolization with drug eluting beads - 04/11/2015    Interim History:  Antonio Fleming is here today for a follow-up. He  underwent a second chemoembolization. This was done in March. He did have MRI done a couple weeks ago. The MRI showed necrotic tumor in the caudate lobe. It was slightly decreased in size. No evidence of new tumor was noted.  Overall, she is be doing fairly well. His weight is down a little bit more. He is not accumulating any ascites.  He is not bleeding. His appetite remains marginal.  He's had no diarrhea. He's had no constipation.  He's had no bruising. He's had no obvious bleeding.  Overall, his performance status is ECOG 3    Medications:   Allergies:  Allergies  Allergen Reactions  . Penicillins Anaphylaxis and Swelling    N/V N/V Has patient had a PCN reaction causing immediate rash, facial/tongue/throat swelling, SOB or lightheadedness with hypotension: Nauseous/vommitting, sweats Has patient had a PCN reaction causing severe rash involving mucus membranes or skin necrosis:  Did PCN reaction that required hospitalization Yes was in the hospital Did PCN reaction occurring within the last 10 years: No- 60 years agoIf all of the above answers are "NO", then may proceed with Cephalosporin use.   . Tylenol [Acetaminophen] Other (See Comments)    Liver problems  . Codeine Rash and Other (See Comments)    Sweating    Past Medical History, Surgical history, Social history, and Family History were reviewed and updated.  Review of Systems: All other 10 point review of systems is negative.   Physical Exam:  height is 5\' 5"  (1.651 m) and weight is 123 lb (55.792 kg). His oral temperature is 97.9 F (36.6 C). His blood pressure is  89/56 and his pulse is 65. His respiration is 16.   Wt Readings from Last 3 Encounters:  06/15/15 123 lb (55.792 kg)  06/13/15 126 lb (57.153 kg)  05/11/15 122 lb (55.339 kg)    Ocular: Sclerae unicteric, pupils equal, round and reactive to light Ear-nose-throat: Oropharynx clear, dentition fair Lymphatic: No cervical supraclavicular or adenopathy Lungs no rales or rhonchi, good excursion bilaterally Heart regular rate and rhythm, no murmur appreciated Abd soft, tender in both left and right lower quadrants, no fluid wave, no liver or spleen tip palpated on exam, positive bowel sounds MSK no focal spinal tenderness, no joint edema Neuro: non-focal, well-oriented, appropriate affect Breasts: Deferred  Lab Results  Component Value Date   WBC 6.9 06/15/2015   HGB 9.7* 06/15/2015   HCT 28.4* 06/15/2015   MCV 96 06/15/2015   PLT 80* 06/15/2015   Lab Results  Component Value Date   FERRITIN 254 04/27/2015   IRON 58 04/27/2015   TIBC 238 04/27/2015   UIBC 180 04/27/2015   IRONPCTSAT 24 04/27/2015   Lab Results  Component Value Date   RETICCTPCT 1.3 09/09/2014   RBC 2.97* 06/15/2015   RETICCTABS 34.1 09/09/2014   No results found for: KPAFRELGTCHN, LAMBDASER, KAPLAMBRATIO No results found for: IGGSERUM, IGA, IGMSERUM No results found for: Odetta Pink, SPEI   Chemistry      Component Value Date/Time   NA 136 04/27/2015 1256   NA 139 04/12/2015 0353   NA 139 09/09/2014 1318   K 4.3 04/27/2015  1256   K 4.3 04/12/2015 0353   K 5.4* 09/09/2014 1318   CL 109 04/12/2015 0353   CL 109* 09/09/2014 1318   CO2 22 04/27/2015 1256   CO2 20* 04/12/2015 0353   CO2 19 09/09/2014 1318   BUN 22.8 04/27/2015 1256   BUN 28* 04/12/2015 0353   BUN 17 09/09/2014 1318   CREATININE 1.4* 04/27/2015 1256   CREATININE 1.57* 04/12/2015 0353   CREATININE 1.41* 11/22/2014 0001      Component Value Date/Time   CALCIUM 10.5* 04/27/2015  1256   CALCIUM 10.0 04/12/2015 0353   CALCIUM 10.7* 09/09/2014 1318   ALKPHOS 166* 04/27/2015 1256   ALKPHOS 80 04/12/2015 0353   ALKPHOS 74 09/09/2014 1318   AST 21 04/27/2015 1256   AST 22 04/12/2015 0353   AST 33 09/09/2014 1318   ALT 22 04/27/2015 1256   ALT 16* 04/12/2015 0353   ALT 20 09/09/2014 1318   BILITOT 1.66* 04/27/2015 1256   BILITOT 1.0 04/12/2015 0353   BILITOT 1.00 09/09/2014 1318     Impression and Plan: Antonio Fleming is 65 yo white male with hepatocellular carcinoma and history of Hep C. He was treated with 2 sessions of chemo embolization with drug eluting beads.  The MRI that he had done looks encouraging. I'm sure that he still has viable tumor.  His performance status is is not that great (ECOG 3) so I really don't think that we have to initiate any therapy on him.  For now, they we probably get him back to see [place] in another 2 months.  I'm not sure as to when his next MRI will be scheduled.  I will check his hepatitis C titers. I stilling this probably is going be a big problem for him. I'm not sure if he sees a gastroenterologist.  I think he does have obvious tumor progression, then we might consider oral therapy for him.   I spent about 30 minutes with him today.   Volanda Napoleon, MD 5/18/20173:10 PM

## 2015-06-15 NOTE — Telephone Encounter (Addendum)
Pt has history of hep c and see Dr. Marin Olp for for liver cancer. But would you call pt to see if he has gastroenterologist or hepatologist that is following hep C. If not let me know and I will refer him to one.

## 2015-06-16 LAB — AFP TUMOR MARKER: AFP, SERUM, TUMOR MARKER: 1.3 ng/mL (ref 0.0–8.3)

## 2015-06-16 LAB — ALPHA FETO PROTEIN (PARALLEL TESTING): AFP TUMOR MARKER: 1.3 ng/mL (ref <6.1)

## 2015-06-19 NOTE — Telephone Encounter (Signed)
Left detailed message for pt to call back and see if he had previously seen a GI doctor and if so to call the office back with the name and number of a GI doctor.

## 2015-06-27 NOTE — Telephone Encounter (Signed)
Letter mailed to pt to call back with any previous gastroenterologist doctors the pt may have seen previously.

## 2015-06-29 ENCOUNTER — Other Ambulatory Visit: Payer: Self-pay | Admitting: *Deleted

## 2015-06-29 DIAGNOSIS — C220 Liver cell carcinoma: Secondary | ICD-10-CM

## 2015-06-29 DIAGNOSIS — G894 Chronic pain syndrome: Secondary | ICD-10-CM

## 2015-06-29 DIAGNOSIS — G4701 Insomnia due to medical condition: Secondary | ICD-10-CM

## 2015-06-29 MED ORDER — OXYCODONE HCL 10 MG PO TABS: 10.0000 mg | ORAL_TABLET | Freq: Four times a day (QID) | ORAL | Status: DC | PRN

## 2015-06-30 MED FILL — oxyCODONE HCL 10 MG TABS: 10 | 30 days supply | Qty: 120 | Fill #0

## 2015-07-14 ENCOUNTER — Other Ambulatory Visit: Payer: Self-pay | Admitting: *Deleted

## 2015-07-14 DIAGNOSIS — C220 Liver cell carcinoma: Secondary | ICD-10-CM

## 2015-07-14 MED ORDER — MORPHINE SULFATE ER 15 MG PO TBCR: 15.0000 mg | EXTENDED_RELEASE_TABLET | Freq: Two times a day (BID) | ORAL | Status: DC

## 2015-07-17 MED FILL — MORPHINE SULF ER 15 MG TAB: 15 | 30 days supply | Qty: 60 | Fill #0

## 2015-07-27 ENCOUNTER — Other Ambulatory Visit: Payer: Self-pay | Admitting: *Deleted

## 2015-07-27 DIAGNOSIS — G4701 Insomnia due to medical condition: Secondary | ICD-10-CM

## 2015-07-27 DIAGNOSIS — C220 Liver cell carcinoma: Secondary | ICD-10-CM

## 2015-07-27 DIAGNOSIS — G894 Chronic pain syndrome: Secondary | ICD-10-CM

## 2015-07-27 MED ORDER — OXYCODONE HCL 10 MG PO TABS: 10.0000 mg | ORAL_TABLET | Freq: Four times a day (QID) | ORAL | Status: DC | PRN

## 2015-07-28 MED FILL — traZODone HCL 50 MG TABS: 50 | 30 days supply | Qty: 30 | Fill #1

## 2015-07-28 MED FILL — oxyCODONE HCL 10 MG TABS: 10 | 30 days supply | Qty: 120 | Fill #0

## 2015-08-17 ENCOUNTER — Ambulatory Visit (HOSPITAL_BASED_OUTPATIENT_CLINIC_OR_DEPARTMENT_OTHER): Payer: Medicare HMO | Admitting: Family

## 2015-08-17 ENCOUNTER — Other Ambulatory Visit (HOSPITAL_BASED_OUTPATIENT_CLINIC_OR_DEPARTMENT_OTHER): Payer: Medicare HMO

## 2015-08-17 ENCOUNTER — Encounter: Payer: Self-pay | Admitting: Family

## 2015-08-17 ENCOUNTER — Telehealth: Payer: Self-pay | Admitting: Emergency Medicine

## 2015-08-17 VITALS — BP 90/53 | HR 55 | Temp 97.9°F | Resp 14 | Ht 65.0 in | Wt 114.0 lb

## 2015-08-17 DIAGNOSIS — G894 Chronic pain syndrome: Secondary | ICD-10-CM

## 2015-08-17 DIAGNOSIS — C220 Liver cell carcinoma: Secondary | ICD-10-CM | POA: Diagnosis not present

## 2015-08-17 DIAGNOSIS — K717 Toxic liver disease with fibrosis and cirrhosis of liver: Secondary | ICD-10-CM

## 2015-08-17 DIAGNOSIS — G4701 Insomnia due to medical condition: Secondary | ICD-10-CM | POA: Diagnosis not present

## 2015-08-17 DIAGNOSIS — R630 Anorexia: Secondary | ICD-10-CM

## 2015-08-17 DIAGNOSIS — Z8619 Personal history of other infectious and parasitic diseases: Secondary | ICD-10-CM

## 2015-08-17 LAB — COMPREHENSIVE METABOLIC PANEL
ALK PHOS: 242 U/L — AB (ref 40–150)
ALT: 52 U/L (ref 0–55)
ANION GAP: 10 meq/L (ref 3–11)
AST: 68 U/L — ABNORMAL HIGH (ref 5–34)
Albumin: 3 g/dL — ABNORMAL LOW (ref 3.5–5.0)
BUN: 31.8 mg/dL — ABNORMAL HIGH (ref 7.0–26.0)
CO2: 19 meq/L — AB (ref 22–29)
Calcium: 10.8 mg/dL — ABNORMAL HIGH (ref 8.4–10.4)
Chloride: 105 mEq/L (ref 98–109)
Creatinine: 2.1 mg/dL — ABNORMAL HIGH (ref 0.7–1.3)
EGFR: 32 mL/min/{1.73_m2} — AB (ref >90)
GLUCOSE: 214 mg/dL — AB (ref 70–140)
POTASSIUM: 3.9 meq/L (ref 3.5–5.1)
SODIUM: 135 meq/L — AB (ref 136–145)
Total Bilirubin: 3.66 mg/dL (ref 0.20–1.20)
Total Protein: 8 g/dL (ref 6.4–8.3)

## 2015-08-17 LAB — CBC WITH DIFFERENTIAL (CANCER CENTER ONLY)
BASO#: 0 10*3/uL (ref 0.0–0.2)
BASO%: 0.2 % (ref 0.0–2.0)
EOS%: 6 % (ref 0.0–7.0)
Eosinophils Absolute: 0.3 10*3/uL (ref 0.0–0.5)
HCT: 29.6 % — ABNORMAL LOW (ref 38.7–49.9)
HGB: 10 g/dL — ABNORMAL LOW (ref 13.0–17.1)
LYMPH#: 0.5 10*3/uL — ABNORMAL LOW (ref 0.9–3.3)
LYMPH%: 9.1 % — AB (ref 14.0–48.0)
MCH: 32.6 pg (ref 28.0–33.4)
MCHC: 33.8 g/dL (ref 32.0–35.9)
MCV: 96 fL (ref 82–98)
MONO#: 0.4 10*3/uL (ref 0.1–0.9)
MONO%: 8.1 % (ref 0.0–13.0)
NEUT#: 4 10*3/uL (ref 1.5–6.5)
NEUT%: 76.6 % (ref 40.0–80.0)
PLATELETS: 106 10*3/uL — AB (ref 145–400)
RBC: 3.07 10*6/uL — AB (ref 4.20–5.70)
RDW: 14 % (ref 11.1–15.7)
WBC: 5.2 10*3/uL (ref 4.0–10.0)

## 2015-08-17 LAB — PROTIME-INR (CHCC SATELLITE)
INR: 1.1 — AB (ref 2.0–3.5)
PROTIME: 13.2 s (ref 10.6–13.4)

## 2015-08-17 MED ORDER — OXYCODONE HCL 10 MG PO TABS: 10.0000 mg | ORAL_TABLET | Freq: Four times a day (QID) | ORAL | Status: DC | PRN

## 2015-08-17 MED ORDER — MORPHINE SULFATE ER 30 MG PO TBCR: 30.0000 mg | EXTENDED_RELEASE_TABLET | Freq: Two times a day (BID) | ORAL | Status: DC

## 2015-08-17 MED ORDER — DRONABINOL 2.5 MG PO CAPS
2.5000 mg | ORAL_CAPSULE | Freq: Two times a day (BID) | ORAL | Status: DC
Start: 2015-08-17 — End: 2015-09-20

## 2015-08-17 MED FILL — MORPHINE SULF 30 MG TAB SA: 30 | 30 days supply | Qty: 60 | Fill #0

## 2015-08-17 MED FILL — SPIRONOLACTONE 50 MG TABLET: 50 | 30 days supply | Qty: 30 | Fill #0

## 2015-08-17 MED FILL — FUROSEMIDE 40 MG TABLET: 40 | 30 days supply | Qty: 30 | Fill #0

## 2015-08-17 MED FILL — PANTOPRAZOLE SOD DR 40 MG T: 40 | 30 days supply | Qty: 30 | Fill #0

## 2015-08-17 MED FILL — DRONABINOL 2.5 MG CAPSULE: 2.5 | 30 days supply | Qty: 60 | Fill #0

## 2015-08-17 MED FILL — PROPRANOLOL 10 MG TABLET: 10 | 30 days supply | Qty: 60 | Fill #0

## 2015-08-17 NOTE — Progress Notes (Signed)
Hematology and Oncology Follow Up Visit  Camdan Floresca [id number] 1950/02/18 65 y.o. 08/17/2015   Principle Diagnosis:  Hepatocellular carcinoma Hepatitis C with cirrhosis/ascites  Current Therapy:   Observation S/p chemo embolization with drug eluting beads - 10/27/2014    Interim History:  Mr. Casselberry is here today for a follow-up. He is feeling fatigued and still having transverse abdominal pain more so in the left side. He states that the megace made him sick and that he does not have much of an appetite. His weight is down 9 lbs since his last visit to 114 lbs.  He has had SOB with exertion especially outdoors in the heat. He is smoking < 1 ppd.  No fever, chills, n/v, cough, rash, dizziness, chest pain, palpitations or changes in bowel or bladder habits.  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 No swelling or tenderness in his extremities at this time. No c/o "bone" pain.   Medications:    Medication List       This list is accurate as of: 08/17/15  2:01 PM.  Always use your most recent med list.               docusate sodium 100 MG capsule  Commonly known as:  COLACE  Take 1 capsule (100 mg total) by mouth 2 (two) times daily.     folic acid 1 MG tablet  Commonly known as:  FOLVITE  Take 1 tablet (1 mg total) by mouth daily.     furosemide 40 MG tablet  Commonly known as:  LASIX  Take 1 tablet (40 mg total) by mouth daily.     megestrol 40 MG/ML suspension  Commonly known as:  MEGACE     morphine 15 MG 12 hr tablet  Commonly known as:  MS CONTIN  Take 1 tablet (15 mg total) by mouth every 12 (twelve) hours.     ondansetron 8 MG tablet  Commonly known as:  ZOFRAN  Take 1 tablet (8 mg total) by mouth every 8 (eight) hours as needed for nausea or vomiting.     Oxycodone HCl 10 MG Tabs  Take 1 tablet (10 mg total) by mouth every 6 (six) hours as needed.     pantoprazole 40 MG tablet  Commonly known as:  PROTONIX  Take 1 tablet (40 mg total) by  mouth daily.     propranolol 10 MG tablet  Commonly known as:  INDERAL  Take 1 tablet (10 mg total) by mouth 2 (two) times daily.     spironolactone 50 MG tablet  Commonly known as:  ALDACTONE  Take 1 tablet (50 mg total) by mouth daily.     traZODone 50 MG tablet  Commonly known as:  DESYREL  Take 1 tablet (50 mg total) by mouth at bedtime.        Allergies:  Allergies  Allergen Reactions  . Penicillins Anaphylaxis and Swelling    N/V N/V Has patient had a PCN reaction causing immediate rash, facial/tongue/throat swelling, SOB or lightheadedness with hypotension: Nauseous/vommitting, sweats Has patient had a PCN reaction causing severe rash involving mucus membranes or skin necrosis: No Did PCN reaction that required hospitalization Yes was in the hospital Did PCN reaction occurring within the last 10 years: No- 60 years agoIf all of the above answers are "NO", then may proceed with Cephalosporin use.   . Tylenol [Acetaminophen] Other (See Comments)    Liver problems  . Codeine Rash and Other (See Comments)  Sweating    Past Medical History, Surgical history, Social history, and Family History were reviewed and updated.  Review of Systems: All other 10 point review of systems is negative.   Physical Exam:  height is 5\' 5"  (1.651 m) and weight is 114 lb (51.71 kg). His oral temperature is 97.9 F (36.6 C). His blood pressure is 90/53 and his pulse is 55. His respiration is 14.   Wt Readings from Last 3 Encounters:  08/17/15 114 lb (51.71 kg)  06/15/15 123 lb (55.792 kg)  06/13/15 126 lb (57.153 kg)    Ocular: Sclerae unicteric, pupils equal, round and reactive to light Ear-nose-throat: Oropharynx clear, dentition fair Lymphatic: No cervical supraclavicular or adenopathy Lungs no rales or rhonchi, good excursion bilaterally Heart regular rate and rhythm, no murmur appreciated Abd soft, tender in both left and right lower quadrants, no fluid wave, no liver or  spleen tip palpated but he was guarded on exam, no fluid wave MSK no focal spinal tenderness, no joint edema Neuro: non-focal, well-oriented, appropriate affect Breasts: Deferred  Lab Results  Component Value Date   WBC 5.2 08/17/2015   HGB 10.0* 08/17/2015   HCT 29.6* 08/17/2015   MCV 96 08/17/2015   PLT 106* 08/17/2015   Lab Results  Component Value Date   FERRITIN 254 04/27/2015   IRON 58 04/27/2015   TIBC 238 04/27/2015   UIBC 180 04/27/2015   IRONPCTSAT 24 04/27/2015   Lab Results  Component Value Date   RETICCTPCT 1.3 09/09/2014   RBC 3.07* 08/17/2015   RETICCTABS 34.1 09/09/2014   No results found for: KPAFRELGTCHN, LAMBDASER, KAPLAMBRATIO No results found for: IGGSERUM, IGA, IGMSERUM No results found for: Odetta Pink, SPEI   Chemistry      Component Value Date/Time   NA 135 06/15/2015 1431   NA 136 04/27/2015 1256   NA 139 04/12/2015 0353   NA 139 09/09/2014 1318   K 4.3 06/15/2015 1431   K 4.3 04/27/2015 1256   K 4.3 04/12/2015 0353   K 5.4* 09/09/2014 1318   CL 101 06/15/2015 1431   CL 109 04/12/2015 0353   CL 109* 09/09/2014 1318   CO2 19 06/15/2015 1431   CO2 22 04/27/2015 1256   CO2 20* 04/12/2015 0353   CO2 19 09/09/2014 1318   BUN 23 06/15/2015 1431   BUN 22.8 04/27/2015 1256   BUN 28* 04/12/2015 0353   BUN 17 09/09/2014 1318   CREATININE 1.25 06/15/2015 1431   CREATININE 1.4* 04/27/2015 1256   CREATININE 1.57* 04/12/2015 0353   CREATININE 1.41* 11/22/2014 0001      Component Value Date/Time   CALCIUM 9.9 06/15/2015 1431   CALCIUM 10.5* 04/27/2015 1256   CALCIUM 10.0 04/12/2015 0353   CALCIUM 10.7* 09/09/2014 1318   ALKPHOS 77 06/15/2015 1431   ALKPHOS 166* 04/27/2015 1256   ALKPHOS 80 04/12/2015 0353   ALKPHOS 74 09/09/2014 1318   AST 30 06/15/2015 1431   AST 21 04/27/2015 1256   AST 22 04/12/2015 0353   AST 33 09/09/2014 1318   ALT 16 06/15/2015 1431   ALT 22 04/27/2015 1256     ALT 16* 04/12/2015 0353   ALT 20 09/09/2014 1318   BILITOT 0.8 06/15/2015 1431   BILITOT 1.66* 04/27/2015 1256   BILITOT 1.0 04/12/2015 0353   BILITOT 1.00 09/09/2014 1318     Impression and Plan: Mr. Fogleman is 65 yo white male with hepatocellular carcinoma and history of Hep C. He  completed his second chemo embolization with drug eluting beads in March 2017. He is still having abdominal discomfort. His abdominal MRI showed necrotic tumor in caudate lobe. He also some splenomegaly at that time.  His Hep C virus antibody was elevated > 11.0. We will refer him to GI to see if he is a candidate to have this treated.  We will repeat an abdominal MRI on him before his next follow-up.  Prescription for Marinol was given to the patient. He will take 2.5 mg PO 30 minutes before his 2 largest meals.  He did not want to stay for fluids. He said his ride needed to get home. He promised to drink more fluids. Increased his MS contin to 30 mg q12h. Refilled Oxycodone, no change.  We will then plan to see him back in 2 months for labs and follow-up. He will contact us with any questions or concerns. We can certainly see him sooner if need be.   Eliezer Bottom, NP 7/20/20172:01 PM

## 2015-08-17 NOTE — Telephone Encounter (Signed)
Received call from [facility] in lab; patient's bilirubin 3.66; Dr Marin Olp aware. No new orders given at this time.

## 2015-08-18 ENCOUNTER — Other Ambulatory Visit: Payer: Self-pay | Admitting: Family

## 2015-08-18 ENCOUNTER — Ambulatory Visit: Payer: Medicare HMO | Admitting: Family

## 2015-08-18 ENCOUNTER — Other Ambulatory Visit: Payer: Medicare HMO

## 2015-08-18 DIAGNOSIS — K717 Toxic liver disease with fibrosis and cirrhosis of liver: Secondary | ICD-10-CM

## 2015-08-18 DIAGNOSIS — Z8619 Personal history of other infectious and parasitic diseases: Secondary | ICD-10-CM

## 2015-08-18 DIAGNOSIS — C220 Liver cell carcinoma: Secondary | ICD-10-CM

## 2015-08-18 LAB — ALPHA FETO PROTEIN (PARALLEL TESTING): AFP-Tumor Marker: 1.5 ng/mL (ref <6.1)

## 2015-08-18 LAB — PREALBUMIN: PREALBUMIN: 15 mg/dL (ref 10–36)

## 2015-08-18 LAB — HEPATITIS C ANTIBODY

## 2015-08-18 LAB — AFP TUMOR MARKER: AFP, Serum, Tumor Marker: 2 ng/mL (ref 0.0–8.3)

## 2015-08-20 LAB — HCV RNA QUANT RFLX ULTRA OR GENOTYP
HCV Quant Baseline: 3560000 IU/mL
HCV log10: 6.551 log10 IU/mL
Hepatitis C Genotype: 3

## 2015-08-21 ENCOUNTER — Encounter: Payer: Self-pay | Admitting: Nurse Practitioner

## 2015-08-21 ENCOUNTER — Other Ambulatory Visit: Payer: Self-pay | Admitting: Family

## 2015-08-21 DIAGNOSIS — C220 Liver cell carcinoma: Secondary | ICD-10-CM

## 2015-08-21 DIAGNOSIS — Z8619 Personal history of other infectious and parasitic diseases: Secondary | ICD-10-CM

## 2015-08-21 DIAGNOSIS — K717 Toxic liver disease with fibrosis and cirrhosis of liver: Secondary | ICD-10-CM

## 2015-08-21 NOTE — Progress Notes (Signed)
Received a call from [facility] with [place]. She stated they received a referral for the patient, however, he is an established patient and was last seen in Feb 2017. Pt was scheduled twice for f/u however he called and cancelled one appointment and did not show for the other.

## 2015-08-29 ENCOUNTER — Other Ambulatory Visit (HOSPITAL_COMMUNITY): Payer: Self-pay | Admitting: Interventional Radiology

## 2015-08-29 DIAGNOSIS — C220 Liver cell carcinoma: Secondary | ICD-10-CM

## 2015-08-30 ENCOUNTER — Other Ambulatory Visit (HOSPITAL_COMMUNITY): Payer: Self-pay | Admitting: Interventional Radiology

## 2015-08-30 DIAGNOSIS — C220 Liver cell carcinoma: Secondary | ICD-10-CM

## 2015-08-31 ENCOUNTER — Other Ambulatory Visit: Payer: Self-pay | Admitting: *Deleted

## 2015-08-31 ENCOUNTER — Other Ambulatory Visit: Payer: Self-pay | Admitting: Nurse Practitioner

## 2015-08-31 DIAGNOSIS — R630 Anorexia: Secondary | ICD-10-CM

## 2015-08-31 DIAGNOSIS — C220 Liver cell carcinoma: Secondary | ICD-10-CM

## 2015-08-31 DIAGNOSIS — G894 Chronic pain syndrome: Secondary | ICD-10-CM

## 2015-08-31 DIAGNOSIS — G4701 Insomnia due to medical condition: Secondary | ICD-10-CM

## 2015-08-31 DIAGNOSIS — K717 Toxic liver disease with fibrosis and cirrhosis of liver: Secondary | ICD-10-CM

## 2015-08-31 DIAGNOSIS — Z8619 Personal history of other infectious and parasitic diseases: Secondary | ICD-10-CM

## 2015-08-31 MED FILL — oxyCODONE HCL 10 MG TABS: 10 | 30 days supply | Qty: 120 | Fill #0

## 2015-08-31 MED FILL — traZODone HCL 50 MG TABS: 50 | 30 days supply | Qty: 30 | Fill #2

## 2015-09-05 ENCOUNTER — Encounter: Payer: Self-pay | Admitting: Radiology

## 2015-09-18 ENCOUNTER — Telehealth: Payer: Self-pay

## 2015-09-18 NOTE — Telephone Encounter (Signed)
I left a message for the pt to call back to set up an office visit with Mackie Pai. Pt as asked to call back to schedule an appointment with his PCP.

## 2015-09-20 ENCOUNTER — Other Ambulatory Visit: Payer: Self-pay | Admitting: Family

## 2015-09-20 ENCOUNTER — Other Ambulatory Visit: Payer: Self-pay | Admitting: Hematology & Oncology

## 2015-09-20 DIAGNOSIS — K717 Toxic liver disease with fibrosis and cirrhosis of liver: Secondary | ICD-10-CM

## 2015-09-20 DIAGNOSIS — C220 Liver cell carcinoma: Secondary | ICD-10-CM

## 2015-09-20 DIAGNOSIS — R630 Anorexia: Secondary | ICD-10-CM

## 2015-09-20 DIAGNOSIS — Z8619 Personal history of other infectious and parasitic diseases: Secondary | ICD-10-CM

## 2015-09-20 DIAGNOSIS — G4701 Insomnia due to medical condition: Secondary | ICD-10-CM

## 2015-09-20 DIAGNOSIS — G894 Chronic pain syndrome: Secondary | ICD-10-CM

## 2015-09-20 MED ORDER — DRONABINOL 2.5 MG PO CAPS: 2.5000 mg | ORAL_CAPSULE | Freq: Two times a day (BID) | ORAL | 0 refills | Status: DC

## 2015-09-20 MED ORDER — MORPHINE SULFATE ER 30 MG PO TBCR: 30.0000 mg | EXTENDED_RELEASE_TABLET | Freq: Two times a day (BID) | ORAL | 0 refills | Status: DC

## 2015-09-20 MED ORDER — FOLIC ACID 1 MG PO TABS: 1.0000 mg | ORAL_TABLET | Freq: Every day | ORAL | 6 refills | Status: DC

## 2015-09-20 MED ORDER — OXYCODONE HCL 10 MG PO TABS: 10.0000 mg | ORAL_TABLET | Freq: Four times a day (QID) | ORAL | 0 refills | Status: DC | PRN

## 2015-09-20 MED FILL — PROPRANOLOL 10 MG TABLET: 10 | 30 days supply | Qty: 60 | Fill #1

## 2015-09-20 MED FILL — FOLIC ACID 1 MG TABLET: 1 | 30 days supply | Qty: 30 | Fill #0

## 2015-09-20 MED FILL — FUROSEMIDE 40 MG TABLET: 40 | 30 days supply | Qty: 30 | Fill #0

## 2015-09-20 MED FILL — MORPHINE SULF 30 MG TAB SA: 30 | 30 days supply | Qty: 60 | Fill #0

## 2015-09-20 MED FILL — PANTOPRAZOLE SOD DR 40 MG T: 40 | 30 days supply | Qty: 30 | Fill #1

## 2015-09-20 MED FILL — DRONABINOL 2.5 MG CAPSULE: 2.5 | 30 days supply | Qty: 60 | Fill #0

## 2015-09-20 MED FILL — SPIRONOLACTONE 50 MG TABLET: 50 | 30 days supply | Qty: 30 | Fill #1

## 2015-09-20 NOTE — Progress Notes (Signed)
Antonio Fleming was able to come in today to get his refill prescriptions for Marinol, MS Contin and Oxycodone. He knows when these are able to be refilled and will hold on to them until they are due in a few days. I also weighed him while he was here and his weight is now 116 lbs and 4 oz so he is up 2 lbs since his last visit. We will plan to see him back in September at his previously scheduled appointment.

## 2015-09-23 ENCOUNTER — Ambulatory Visit (HOSPITAL_BASED_OUTPATIENT_CLINIC_OR_DEPARTMENT_OTHER): Payer: Medicare HMO

## 2015-09-27 ENCOUNTER — Other Ambulatory Visit: Payer: Self-pay | Admitting: Nurse Practitioner

## 2015-09-27 ENCOUNTER — Other Ambulatory Visit: Payer: Self-pay | Admitting: *Deleted

## 2015-09-27 DIAGNOSIS — C220 Liver cell carcinoma: Secondary | ICD-10-CM

## 2015-09-28 ENCOUNTER — Other Ambulatory Visit (HOSPITAL_BASED_OUTPATIENT_CLINIC_OR_DEPARTMENT_OTHER): Payer: Medicare HMO

## 2015-09-28 DIAGNOSIS — C220 Liver cell carcinoma: Secondary | ICD-10-CM | POA: Diagnosis not present

## 2015-09-28 LAB — CMP (CANCER CENTER ONLY)
ALBUMIN: 2.8 g/dL — AB (ref 3.3–5.5)
ALK PHOS: 115 U/L — AB (ref 26–84)
ALT(SGPT): 20 U/L (ref 10–47)
AST: 24 U/L (ref 11–38)
BUN, Bld: 25 mg/dL — ABNORMAL HIGH (ref 7–22)
CALCIUM: 10.5 mg/dL — AB (ref 8.0–10.3)
CO2: 20 meq/L (ref 18–33)
Chloride: 108 mEq/L (ref 98–108)
Creat: 1.4 mg/dl — ABNORMAL HIGH (ref 0.6–1.2)
GLUCOSE: 149 mg/dL — AB (ref 73–118)
POTASSIUM: 4.6 meq/L (ref 3.3–4.7)
Sodium: 134 mEq/L (ref 128–145)
Total Bilirubin: 1.3 mg/dl (ref 0.20–1.60)
Total Protein: 6.8 g/dL (ref 6.4–8.1)

## 2015-09-28 MED FILL — oxyCODONE HCL 10 MG TABS: 10 | 30 days supply | Qty: 120 | Fill #0

## 2015-09-30 ENCOUNTER — Ambulatory Visit (HOSPITAL_BASED_OUTPATIENT_CLINIC_OR_DEPARTMENT_OTHER)
Admission: RE | Admit: 2015-09-30 | Discharge: 2015-09-30 | Disposition: A | Discharge status: Home / Self Care | Payer: Medicare HMO | Source: Ambulatory Visit | Attending: Interventional Radiology | Admitting: Interventional Radiology

## 2015-09-30 ENCOUNTER — Other Ambulatory Visit (HOSPITAL_BASED_OUTPATIENT_CLINIC_OR_DEPARTMENT_OTHER): Payer: Medicare HMO

## 2015-09-30 DIAGNOSIS — R161 Splenomegaly, not elsewhere classified: Secondary | ICD-10-CM | POA: Diagnosis not present

## 2015-09-30 DIAGNOSIS — K8070 Calculus of gallbladder and bile duct without cholecystitis without obstruction: Secondary | ICD-10-CM | POA: Insufficient documentation

## 2015-09-30 DIAGNOSIS — R188 Other ascites: Secondary | ICD-10-CM | POA: Diagnosis not present

## 2015-09-30 DIAGNOSIS — C220 Liver cell carcinoma: Secondary | ICD-10-CM | POA: Diagnosis present

## 2015-09-30 DIAGNOSIS — K7460 Unspecified cirrhosis of liver: Secondary | ICD-10-CM | POA: Diagnosis not present

## 2015-09-30 MED ORDER — GADOBENATE DIMEGLUMINE 529 MG/ML IV SOLN: 10.0000 mL | Freq: Once | INTRAVENOUS | Status: DC | PRN

## 2015-10-03 ENCOUNTER — Ambulatory Visit
Admission: RE | Admit: 2015-10-03 | Discharge: 2015-10-03 | Disposition: A | Discharge status: Home / Self Care | Payer: Medicare HMO | Source: Ambulatory Visit | Attending: Interventional Radiology | Admitting: Interventional Radiology

## 2015-10-03 DIAGNOSIS — C220 Liver cell carcinoma: Secondary | ICD-10-CM

## 2015-10-03 HISTORY — PX: IR GENERIC HISTORICAL: IMG1180011

## 2015-10-03 NOTE — Progress Notes (Signed)
Chief Complaint: Patient was seen in follow-up today for  Chief Complaint  Patient presents with  . Follow-up    follow up DEB-TACE   at the request of Devona Holmes  Referring Physician(s): Burney Gauze  History of Present Illness: Antonio Fleming is a 65 y.o. male hepatocellular carcinoma status post treatment with chemoembolization (drug-eluting beads) initially in September 2016. He had a good initial response to therapy with some residual peripheral nodular enhancement in the tumor bed. Therefore, repeat chemoembolization was performed on 04/11/2015.  He presents today for his 6 month postoperative evaluation.   Unfortunately, Antonio Fleming is now separated from his wife for the past month.  He is otherwise doing well. He continues to have intermittent abdominal and leg pains as he has for many years. He continues to take OxyContin as prescribed by the pain clinic. His appetite has been good and he reports he has gained some weight back.  Past Medical History:  Diagnosis Date  . Absolute anemia 09/26/2008  . Anxiety   . Arthritis   . Blood transfusion without reported diagnosis   . Cancer ([place])   . Cataract   . Cirrhosis ([place])   . Depression   . GERD (gastroesophageal reflux disease)   . GI bleed   . Hypertension   . Recurrent hepatocellular carcinoma ([place]) 04/28/2015  . Renal disorder   . Substance abuse   . Tuberculosis     Past Surgical History:  Procedure Laterality Date  . ABDOMINAL SURGERY     band around gut at [facility]  . EYE SURGERY  2013     Bilateral cataract surgery    . THROAT SURGERY      Allergies: Penicillins; Tylenol [acetaminophen]; and Codeine  Medications: Prior to Admission medications   Medication Sig Start Date End Date Taking? Authorizing Provider  docusate sodium (COLACE) 100 MG capsule Take 1 capsule (100 mg total) by mouth 2 (two) times daily. 04/12/15   Saverio Danker, PA-C  dronabinol (MARINOL) 2.5 MG capsule Take 1  capsule (2.5 mg total) by mouth 2 (two) times daily before lunch and supper. 09/20/15   Eliezer Bottom, NP  folic acid (FOLVITE) 1 MG tablet Take 1 tablet (1 mg total) by mouth daily. 09/20/15   Eliezer Bottom, NP  furosemide (LASIX) 40 MG tablet TAKE 1 TABLET BY MOUTH DAILY 09/20/15   Volanda Napoleon, MD  morphine (MS CONTIN) 30 MG 12 hr tablet Take 1 tablet (30 mg total) by mouth every 12 (twelve) hours. 09/20/15   Eliezer Bottom, NP  ondansetron (ZOFRAN) 8 MG tablet Take 1 tablet (8 mg total) by mouth every 8 (eight) hours as needed for nausea or vomiting. 04/12/15   Saverio Danker, PA-C  Oxycodone HCl 10 MG TABS Take 1 tablet (10 mg total) by mouth every 6 (six) hours as needed. 09/20/15   Eliezer Bottom, NP  pantoprazole (PROTONIX) 40 MG tablet Take 1 tablet (40 mg total) by mouth daily. 05/31/15   Volanda Napoleon, MD  propranolol (INDERAL) 10 MG tablet Take 1 tablet (10 mg total) by mouth 2 (two) times daily. 03/08/15   Percell Miller Saguier, PA-C  spironolactone (ALDACTONE) 50 MG tablet Take 1 tablet (50 mg total) by mouth daily. 01/13/15   Volanda Napoleon, MD  traZODone (DESYREL) 50 MG tablet Take 1 tablet (50 mg total) by mouth at bedtime. 04/27/15   Volanda Napoleon, MD     Family History  Problem Relation Age of Onset  .  Heart disease Mother   . Cancer Father     esophagus cancer  . Cancer Brother   . Heart disease Brother     triple bypass   . Cancer Brother     Social History   Social History  . Marital status: Divorced    Spouse name: N/A  . Number of children: N/A  . Years of education: N/A   Social History Main Topics  . Smoking status: Current Every Day Smoker    Packs/day: 0.75    Years: 49.00    Types: Cigarettes    Start date: 03/20/1973  . Smokeless tobacco: Never Used     Comment: 08-18-14  still smoking  . Alcohol use 0.0 oz/week  . Drug use: No  . Sexual activity: Not on file   Other Topics Concern  . Not on file   Social History Narrative  . No  narrative on file    ECOG Status: 2 - Symptomatic, <50% confined to bed  Review of Systems: A 12 point ROS discussed and pertinent positives are indicated in the HPI above.  All other systems are negative.  Review of Systems  Vital Signs: BP 102/63 (BP Location: Right Arm, Patient Position: Sitting, Cuff Size: Normal)   Pulse 60   Temp 98 F (36.7 C) (Oral)   Resp 14   Ht 5\' 5"  (1.651 m)   Wt 115 lb 6.4 oz (52.3 kg)   SpO2 94%   BMI 19.20 kg/m   Physical Exam  Constitutional: He is oriented to person, place, and time. He appears well-developed and well-nourished. No distress.  HENT:  Head: Normocephalic and atraumatic.  Cardiovascular: Normal rate.   Pulmonary/Chest: Effort normal.  Abdominal: Soft. He exhibits no distension. There is no tenderness.  Neurological: He is alert and oriented to person, place, and time.  Skin: Skin is warm and dry.  Psychiatric: He has a normal mood and affect. His behavior is normal.  Vitals reviewed.   Imaging: Mr Abdomen W Wo Contrast  Result Date: 10/01/2015 CLINICAL DATA:  5 month follow-up DEB-TACE for HCC, sharp constant pain x2 months EXAM: MRI ABDOMEN WITHOUT AND WITH CONTRAST TECHNIQUE: Multiplanar multisequence MR imaging of the abdomen was performed both before and after the administration of intravenous contrast. CONTRAST:  10 mL Multihance IV COMPARISON:  06/03/2015 FINDINGS: Lower chest:  Lung bases are clear. Hepatobiliary: Cirrhosis. Peripherally enhancing, centrally necrotic mass in the caudate, measuring 5.3 x 5.4 x 3.6 cm (previously 5.7 x 4.6 x 3.9 cm), overall grossly unchanged. Nodular enhancement persists along the lateral margin of the lesion (series 9/ image 19). Additional 8 mm enhancing focus in segment 5 (series 9/ image 31), grossly unchanged. Attention on follow-up is suggested. Layering gallstones (series 16/ image 18). Proximal common duct is dilated, measuring 11 mm. Associated 7 mm distal CBD stone (series 3/image  12). Pancreas: Within normal limits. Spleen: Enlarged, measuring 14.6 cm in craniocaudal dimension, unchanged. Stable subcentimeter lesion in the inferior spleen (series 4/ image 18), likely benign. Adrenals/Urinary Tract: Bilateral adrenal glands are within normal limits. Bilateral renal cysts, including a dominant 3.4 cm right upper pole renal cyst. No hydronephrosis. Stomach/Bowel: Stomach is within normal limits. Visualized bowel is unremarkable. Vascular/Lymphatic: No evidence of abdominal aortic aneurysm. Perigastric varices, including a mildly prominent varix posterior to the gastric cardia (series 12/image 20). Portal vein is patent. Small upper abdominal lymph nodes, including a 12 mm short axis node in the porta hepatis (series 5/image 8). Other: Moderate abdominal ascites. Musculoskeletal:  No focal osseous lesions. IMPRESSION: 5.4 cm mass in the caudate, corresponding to the patient's known HCC, overall grossly unchanged. Additional 8 mm enhancing focus in segment 5, grossly unchanged. Differential considerations include dysplastic nodule versus early Tullos. Attention on follow-up is suggested. Cholelithiasis with choledocholithiasis. Associated 7 mm distal CBD stone. Cirrhosis with splenomegaly, perigastric varices, and moderate upper abdominal ascites. Upper abdominal lymph nodes, including a 12 mm short axis node in the porta hepatis, likely reactive. Electronically Signed   By: Julian Hy M.D.   On: 10/01/2015 09:04    Labs:  CBC:  Recent Labs  04/12/15 0353 04/27/15 1256 06/15/15 1413 08/17/15 1314  WBC 5.3 5.5 6.9 5.2  HGB 9.6* 10.1* 9.7* 10.0*  HCT 27.9* 28.9* 28.4* 29.6*  PLT 69* 147 80* 106*    COAGS:  Recent Labs  10/27/14 0950 11/22/14 0001 04/11/15 1244 04/27/15 1322 08/17/15 1314  INR 1.16 1.22 1.26 1.2* 1.1*  APTT 34  --   --   --   --     BMP:  Recent Labs  03/13/15 1405 04/11/15 1244 04/12/15 0353 04/27/15 1256 06/15/15 1431 08/17/15 1315  09/28/15 1402  NA 141 139 139 136 135 135* 134  K 3.7 4.5 4.3 4.3 4.3 3.9 4.6  CL 109 108 109  --  101  --  108  CO2 20* 24 20* 22 19 19* 20  GLUCOSE 211* 134* 206* 168* 168* 214* 149*  BUN 25* 32* 28* 22.8 23 31.8* 25*  CALCIUM 10.5* 10.6* 10.0 10.5* 9.9 10.8* 10.5*  CREATININE 1.31* 1.52* 1.57* 1.4* 1.25 2.1* 1.4*  GFRNONAA 55* 46* 44*  --  60  --   --   GFRAA >60 53* 51*  --  69  --   --     LIVER FUNCTION TESTS:  Recent Labs  04/27/15 1256 06/15/15 1431 08/17/15 1315 09/28/15 1402  BILITOT 1.66* 0.8 3.66* 1.30  AST 21 30 68* 24  ALT 22 16 52 20  ALKPHOS 166* 77 242* 115*  PROT 7.8 7.3 8.0 6.8  ALBUMIN 3.0* 3.3* 3.0* 2.8*    TUMOR MARKERS:  Recent Labs  06/15/15 1413 08/17/15 1314  AFPTM 1.3 1.5    Assessment and Plan:  Although the overall tumor size is grossly unchanged on today's MRI, there is increasing enhancing nodularity along the superior and lateral margins of the treated tumor bed concerning for disease recurrence/progression.  I spent a long time discussing with Antonio Fleming the options of repeating chemoembolization now versus continuing observation. At this time he has not feel he could tolerate another chemoembolization procedure given all that is going on in his life.  He does think that he would be willing to undergo the procedure if his next MRI demonstrates continued progression of disease.  1.)  Repeat MRI with [facility] in 3 months, liver labs and clinic visit. If there is further evidence of disease progression at that time we will likely proceed with repeat drug-eluting bead chemoembolization.     Electronically Signed: Jacqulynn Cadet 10/03/2015, 4:34 PM   I spent a total of 15 Minutes in face to face in clinical consultation, greater than 50% of which was counseling/coordinating care for hepatocellular cancer.

## 2015-10-19 ENCOUNTER — Other Ambulatory Visit: Payer: Medicare HMO

## 2015-10-19 ENCOUNTER — Ambulatory Visit: Payer: Medicare HMO | Admitting: Family

## 2015-10-20 ENCOUNTER — Other Ambulatory Visit: Payer: Self-pay | Admitting: Hematology & Oncology

## 2015-10-20 ENCOUNTER — Other Ambulatory Visit: Payer: Self-pay | Admitting: *Deleted

## 2015-10-20 DIAGNOSIS — R630 Anorexia: Secondary | ICD-10-CM

## 2015-10-20 DIAGNOSIS — C220 Liver cell carcinoma: Secondary | ICD-10-CM

## 2015-10-20 DIAGNOSIS — G4701 Insomnia due to medical condition: Secondary | ICD-10-CM

## 2015-10-20 DIAGNOSIS — G894 Chronic pain syndrome: Secondary | ICD-10-CM

## 2015-10-20 DIAGNOSIS — K717 Toxic liver disease with fibrosis and cirrhosis of liver: Secondary | ICD-10-CM

## 2015-10-20 DIAGNOSIS — Z8619 Personal history of other infectious and parasitic diseases: Secondary | ICD-10-CM

## 2015-10-20 MED ORDER — TRAZODONE HCL 50 MG PO TABS: 50.0000 mg | ORAL_TABLET | Freq: Every day | ORAL | 2 refills | Status: DC

## 2015-10-20 MED ORDER — FOLIC ACID 1 MG PO TABS: 1.0000 mg | ORAL_TABLET | Freq: Every day | ORAL | 6 refills | Status: DC

## 2015-10-20 MED ORDER — MORPHINE SULFATE ER 30 MG PO TBCR: 30.0000 mg | EXTENDED_RELEASE_TABLET | Freq: Two times a day (BID) | ORAL | 0 refills | Status: DC

## 2015-10-20 MED FILL — FOLIC ACID 1 MG TABLET: 1 | 30 days supply | Qty: 30 | Fill #1

## 2015-10-20 MED FILL — PANTOPRAZOLE SOD DR 40 MG T: 40 | 30 days supply | Qty: 30 | Fill #2

## 2015-10-20 MED FILL — MORPHINE SULF ER 30 MG TAB: 30 | 30 days supply | Qty: 60 | Fill #0

## 2015-10-20 MED FILL — FUROSEMIDE 40 MG TABLET: 40 | 30 days supply | Qty: 30 | Fill #1

## 2015-10-20 MED FILL — SPIRONOLACTONE 50 MG TABLET: 50 | 30 days supply | Qty: 30 | Fill #0

## 2015-10-20 MED FILL — traZODone HCL 50 MG TABS: 50 | 30 days supply | Qty: 30 | Fill #0

## 2015-10-24 ENCOUNTER — Other Ambulatory Visit: Payer: Self-pay | Admitting: *Deleted

## 2015-10-24 DIAGNOSIS — K717 Toxic liver disease with fibrosis and cirrhosis of liver: Secondary | ICD-10-CM

## 2015-10-24 DIAGNOSIS — G4701 Insomnia due to medical condition: Secondary | ICD-10-CM

## 2015-10-24 DIAGNOSIS — C220 Liver cell carcinoma: Secondary | ICD-10-CM

## 2015-10-24 DIAGNOSIS — R630 Anorexia: Secondary | ICD-10-CM

## 2015-10-24 DIAGNOSIS — G894 Chronic pain syndrome: Secondary | ICD-10-CM

## 2015-10-24 DIAGNOSIS — Z8619 Personal history of other infectious and parasitic diseases: Secondary | ICD-10-CM

## 2015-10-24 MED ORDER — TRAZODONE HCL 50 MG PO TABS: 50.0000 mg | ORAL_TABLET | Freq: Every day | ORAL | 2 refills | Status: DC

## 2015-10-24 MED ORDER — OXYCODONE HCL 10 MG PO TABS: 10.0000 mg | ORAL_TABLET | Freq: Four times a day (QID) | ORAL | 0 refills | Status: DC | PRN

## 2015-10-26 ENCOUNTER — Other Ambulatory Visit (HOSPITAL_BASED_OUTPATIENT_CLINIC_OR_DEPARTMENT_OTHER): Payer: Medicare HMO

## 2015-10-26 ENCOUNTER — Ambulatory Visit (HOSPITAL_BASED_OUTPATIENT_CLINIC_OR_DEPARTMENT_OTHER): Payer: Medicare HMO | Admitting: Family

## 2015-10-26 ENCOUNTER — Encounter: Payer: Self-pay | Admitting: Family

## 2015-10-26 VITALS — BP 90/61 | HR 64 | Temp 97.4°F | Resp 20 | Ht 65.0 in | Wt 109.0 lb

## 2015-10-26 DIAGNOSIS — C220 Liver cell carcinoma: Secondary | ICD-10-CM

## 2015-10-26 DIAGNOSIS — D696 Thrombocytopenia, unspecified: Secondary | ICD-10-CM | POA: Diagnosis not present

## 2015-10-26 DIAGNOSIS — Z8619 Personal history of other infectious and parasitic diseases: Secondary | ICD-10-CM

## 2015-10-26 DIAGNOSIS — K717 Toxic liver disease with fibrosis and cirrhosis of liver: Secondary | ICD-10-CM

## 2015-10-26 LAB — COMPREHENSIVE METABOLIC PANEL
ALBUMIN: 2.9 g/dL — AB (ref 3.5–5.0)
ALK PHOS: 268 U/L — AB (ref 40–150)
ALT: 46 U/L (ref 0–55)
AST: 55 U/L — AB (ref 5–34)
Anion Gap: 11 mEq/L (ref 3–11)
BILIRUBIN TOTAL: 3.13 mg/dL — AB (ref 0.20–1.20)
BUN: 38.8 mg/dL — ABNORMAL HIGH (ref 7.0–26.0)
CO2: 21 meq/L — AB (ref 22–29)
CREATININE: 2.2 mg/dL — AB (ref 0.7–1.3)
Calcium: 10.7 mg/dL — ABNORMAL HIGH (ref 8.4–10.4)
Chloride: 103 mEq/L (ref 98–109)
EGFR: 30 mL/min/{1.73_m2} — ABNORMAL LOW (ref >90)
GLUCOSE: 277 mg/dL — AB (ref 70–140)
Potassium: 4.1 mEq/L (ref 3.5–5.1)
Sodium: 135 mEq/L — ABNORMAL LOW (ref 136–145)
TOTAL PROTEIN: 8 g/dL (ref 6.4–8.3)

## 2015-10-26 LAB — CBC WITH DIFFERENTIAL (CANCER CENTER ONLY)
BASO#: 0 10*3/uL (ref 0.0–0.2)
BASO%: 0.2 % (ref 0.0–2.0)
EOS%: 4.3 % (ref 0.0–7.0)
Eosinophils Absolute: 0.2 10*3/uL (ref 0.0–0.5)
HCT: 29.1 % — ABNORMAL LOW (ref 38.7–49.9)
HGB: 10.1 g/dL — ABNORMAL LOW (ref 13.0–17.1)
LYMPH#: 0.4 10*3/uL — ABNORMAL LOW (ref 0.9–3.3)
LYMPH%: 9.2 % — AB (ref 14.0–48.0)
MCH: 32.1 pg (ref 28.0–33.4)
MCHC: 34.7 g/dL (ref 32.0–35.9)
MCV: 92 fL (ref 82–98)
MONO#: 0.3 10*3/uL (ref 0.1–0.9)
MONO%: 7.2 % (ref 0.0–13.0)
NEUT#: 3.3 10*3/uL (ref 1.5–6.5)
NEUT%: 79.1 % (ref 40.0–80.0)
PLATELETS: 93 10*3/uL — AB (ref 145–400)
RBC: 3.15 10*6/uL — ABNORMAL LOW (ref 4.20–5.70)
RDW: 14.4 % (ref 11.1–15.7)
WBC: 4.2 10*3/uL (ref 4.0–10.0)

## 2015-10-26 LAB — PROTIME-INR (CHCC SATELLITE)
INR: 1.2 — ABNORMAL LOW (ref 2.0–3.5)
Protime: 14.4 Seconds — ABNORMAL HIGH (ref 10.6–13.4)

## 2015-10-26 MED FILL — oxyCODONE HCL 10 MG TABS: 10 | 30 days supply | Qty: 120 | Fill #0

## 2015-10-26 NOTE — Progress Notes (Signed)
Hematology and Oncology Follow Up Visit  Antonio Fleming [id number] 30-Dec-1950 65 y.o. 10/26/2015   Principle Diagnosis:  Hepatocellular carcinoma Hepatitis C with cirrhosis/ascites  Current Therapy:   Observation S/p chemo embolization with drug eluting beads - 10/27/2014    Interim History:  Antonio Fleming is here today for a follow-up. He continues to lose weight and is down another 6 lbs. He states that he is living in a tent out in the woods but would not specify where. He said he is waiting to be approved for housing. This makes things difficult in regards to involving hospice.  He was seen by interventional radiology earlier this month. Antonio Fleming did not feel that he could tolerate a repeat chemoembolization at this time. They will repeat another MRI in 3 months and if he has progression of disease he states he will consent to being treated.  He states that he is eating and drinking fluids.  He continues to have pain in the right upper abdomen and ribcage and is out of his pain medication. This is not a new pain issue for him. He will get his refill scripts today.  His SOB with exertion is unchanged. He is still smoking < 1 ppd and trying to quit.  No fever, chills, n/v, cough, rash, dizziness, headache, chest pain, palpitations or changes in bowel or bladder habits.  The neuropathy in his feet this is unchanged. No swelling or tenderness in his extremities at this time.   Medications:    Medication List       Accurate as of 10/26/15  1:30 PM. Always use your most recent med list.          docusate sodium 100 MG capsule Commonly known as:  COLACE Take 1 capsule (100 mg total) by mouth 2 (two) times daily.   dronabinol 2.5 MG capsule Commonly known as:  MARINOL Take 1 capsule (2.5 mg total) by mouth 2 (two) times daily before lunch and supper.   folic acid 1 MG tablet Commonly known as:  FOLVITE Take 1 tablet (1 mg total) by mouth daily.   furosemide 40 MG tablet Commonly  known as:  LASIX TAKE 1 TABLET BY MOUTH DAILY   morphine 30 MG 12 hr tablet Commonly known as:  MS CONTIN Take 1 tablet (30 mg total) by mouth every 12 (twelve) hours.   ondansetron 8 MG tablet Commonly known as:  ZOFRAN Take 1 tablet (8 mg total) by mouth every 8 (eight) hours as needed for nausea or vomiting.   Oxycodone HCl 10 MG Tabs Take 1 tablet (10 mg total) by mouth every 6 (six) hours as needed.   pantoprazole 40 MG tablet Commonly known as:  PROTONIX Take 1 tablet (40 mg total) by mouth daily.   propranolol 10 MG tablet Commonly known as:  INDERAL Take 1 tablet (10 mg total) by mouth 2 (two) times daily.   spironolactone 50 MG tablet Commonly known as:  ALDACTONE TAKE 1 TABLET BY MOUTH DAILY   traZODone 50 MG tablet Commonly known as:  DESYREL Take 1 tablet (50 mg total) by mouth at bedtime.       Allergies:  PCN Tylenol  Codeine   Past Medical History, Surgical history, Social history, and Family History were reviewed and updated.  Review of Systems: All other 10 point review of systems is negative.   Physical Exam:  height is 5\' 5"  (1.651 m) and weight is 109 lb (49.4 kg). His oral temperature is 97.4 F (36.3  C). His blood pressure is 90/61 and his pulse is 64. His respiration is 20.   Wt Readings from Last 3 Encounters:  10/26/15 109 lb (49.4 kg)  10/03/15 115 lb 6.4 oz (52.3 kg)  08/17/15 114 lb (51.7 kg)    Ocular: Sclerae unicteric, pupils equal, round and reactive to light Ear-nose-throat: Oropharynx clear, dentition fair Lymphatic: No cervical supraclavicular or adenopathy Lungs no rales or rhonchi, good excursion bilaterally Heart regular rate and rhythm, no murmur appreciated Abd soft, tender in right upper quadrant, no fluid wave, no liver or spleen tip palpated but was guarded on exam, no fluid wave MSK no focal spinal tenderness, no joint edema Neuro: non-focal, well-oriented, appropriate affect Breasts: Deferred  Lab Results    Component Value Date   WBC 4.2 10/26/2015   HGB 10.1 (L) 10/26/2015   HCT 29.1 (L) 10/26/2015   MCV 92 10/26/2015   PLT 93 (L) 10/26/2015   Lab Results  Component Value Date   FERRITIN 254 04/27/2015   IRON 58 04/27/2015   TIBC 238 04/27/2015   UIBC 180 04/27/2015   IRONPCTSAT 24 04/27/2015   Lab Results  Component Value Date   RETICCTPCT 1.3 09/09/2014   RBC 3.15 (L) 10/26/2015   RETICCTABS 34.1 09/09/2014   No results found for: KPAFRELGTCHN, LAMBDASER, KAPLAMBRATIO No results found for: IGGSERUM, IGA, IGMSERUM No results found for: Kathrynn Ducking, MSPIKE, SPEI   Chemistry      Component Value Date/Time   NA 134 09/28/2015 1402   NA 135 (L) 08/17/2015 1315   K 4.6 09/28/2015 1402   K 3.9 08/17/2015 1315   CL 108 09/28/2015 1402   CO2 20 09/28/2015 1402   CO2 19 (L) 08/17/2015 1315   BUN 25 (H) 09/28/2015 1402   BUN 31.8 (H) 08/17/2015 1315   CREATININE 1.4 (H) 09/28/2015 1402   CREATININE 2.1 (H) 08/17/2015 1315      Component Value Date/Time   CALCIUM 10.5 (H) 09/28/2015 1402   CALCIUM 10.8 (H) 08/17/2015 1315   ALKPHOS 115 (H) 09/28/2015 1402   ALKPHOS 242 (H) 08/17/2015 1315   AST 24 09/28/2015 1402   AST 68 (H) 08/17/2015 1315   ALT 20 09/28/2015 1402   ALT 52 08/17/2015 1315   BILITOT 1.30 09/28/2015 1402   BILITOT 3.66 (HH) 08/17/2015 1315     Impression and Plan: Antonio Fleming is 65 yo white male with hepatocellular carcinoma and history of Hep C. He completed his second chemo embolization with drug eluting beads in March 2017. He continues to lose weight and states that he is now living in a tent in the woods. He would not elaborate on this.  He is followed closely by interventional radiology but does not want to be treated at this time. He agreed to a repeat MRI in 3 months and will consent to treatment at that time if there is progression of disease.  After the patient left our office today, his CMP came back  showing dehydration. His kidney function tests and LFT's were quite elevated. We called to set him up for fluids and he will be able to get a ride here on Monday. Dr. Marin Olp also plans to discuss getting hospice involved. This may be difficult if he is truly in a tent out in the woods. We will see if we can find his location and help him with lodging if possible. This really is a sad situation.  We will go ahead and plan to  see him back in 2 months for repeat labs and follow-up. He will contact us with any questions or concerns. He does have a cell phone. We can certainly see him sooner if need be.   Eliezer Bottom, NP 9/28/20171:30 PM

## 2015-10-27 LAB — AFP TUMOR MARKER: AFP, SERUM, TUMOR MARKER: 1.1 ng/mL (ref 0.0–8.3)

## 2015-10-27 LAB — ALPHA FETO PROTEIN (PARALLEL TESTING): AFP TUMOR MARKER: 1.2 ng/mL (ref <6.1)

## 2015-10-27 LAB — PREALBUMIN: PREALBUMIN: 14 mg/dL (ref 10–36)

## 2015-10-30 ENCOUNTER — Ambulatory Visit (HOSPITAL_BASED_OUTPATIENT_CLINIC_OR_DEPARTMENT_OTHER): Payer: Medicare HMO

## 2015-10-30 DIAGNOSIS — C220 Liver cell carcinoma: Secondary | ICD-10-CM

## 2015-10-30 DIAGNOSIS — Z23 Encounter for immunization: Secondary | ICD-10-CM | POA: Diagnosis not present

## 2015-10-30 DIAGNOSIS — R944 Abnormal results of kidney function studies: Secondary | ICD-10-CM

## 2015-10-30 MED ORDER — SODIUM CHLORIDE 0.9 % IV SOLN
1000.0000 mL | Freq: Once | INTRAVENOUS | Status: AC
  Administered 2015-10-30: 1000 mL via INTRAVENOUS

## 2015-10-30 MED ORDER — INFLUENZA VAC SPLIT QUAD 0.5 ML IM SUSY
0.5000 mL | PREFILLED_SYRINGE | Freq: Once | INTRAMUSCULAR | Status: AC
  Administered 2015-10-30: 0.5 mL via INTRAMUSCULAR
  Filled 2015-10-30: qty 0.5

## 2015-10-30 NOTE — Patient Instructions (Signed)

## 2015-10-31 ENCOUNTER — Encounter: Payer: Self-pay | Admitting: Interventional Radiology

## 2015-10-31 ENCOUNTER — Encounter: Payer: Self-pay | Admitting: *Deleted

## 2015-10-31 NOTE — Progress Notes (Signed)
[place] Work  Clinical Social Work was referred by [employer]er, [employer] for housing and assessment of psychosocial needs.  Clinical Social Worker contacted patient at home to offer support and assess for needs.  Patient stated he applied to [facility] in July and was currently on the waiting list.  Patient stated he was staying in a tent, but also stayed with his daughter "a couple nights a week".  CSW and patient discussed shelters and other housing resources.  Patient is familiar with the shelter and stated he has stayed there in the past.  Patient does have income through social security.  Patient stated the rent options he could afford were "[place] area of town".  CSW will continue researching housing resources.  CSW encouraged patient to call with questions or concerns.         Johnnye Lana, MSW, LCSW, OSW-C Clinical Social Worker [facility] [phone]

## 2015-11-09 ENCOUNTER — Emergency Department (HOSPITAL_COMMUNITY): Payer: Medicare HMO

## 2015-11-09 ENCOUNTER — Encounter (HOSPITAL_COMMUNITY): Payer: Self-pay

## 2015-11-09 ENCOUNTER — Inpatient Hospital Stay (HOSPITAL_COMMUNITY)
Admission: EM | Admit: 2015-11-09 | Discharge: 2015-11-14 | DRG: 371 | Disposition: A | Discharge status: Skilled Nursing Facility | Payer: Medicare HMO | Attending: Internal Medicine | Admitting: Internal Medicine

## 2015-11-09 DIAGNOSIS — D638 Anemia in other chronic diseases classified elsewhere: Secondary | ICD-10-CM | POA: Diagnosis present

## 2015-11-09 DIAGNOSIS — G894 Chronic pain syndrome: Secondary | ICD-10-CM | POA: Diagnosis not present

## 2015-11-09 DIAGNOSIS — R112 Nausea with vomiting, unspecified: Secondary | ICD-10-CM | POA: Diagnosis not present

## 2015-11-09 DIAGNOSIS — R630 Anorexia: Secondary | ICD-10-CM

## 2015-11-09 DIAGNOSIS — R531 Weakness: Secondary | ICD-10-CM | POA: Diagnosis not present

## 2015-11-09 DIAGNOSIS — K219 Gastro-esophageal reflux disease without esophagitis: Secondary | ICD-10-CM | POA: Diagnosis present

## 2015-11-09 DIAGNOSIS — Z515 Encounter for palliative care: Secondary | ICD-10-CM | POA: Diagnosis present

## 2015-11-09 DIAGNOSIS — F329 Major depressive disorder, single episode, unspecified: Secondary | ICD-10-CM | POA: Diagnosis present

## 2015-11-09 DIAGNOSIS — R627 Adult failure to thrive: Secondary | ICD-10-CM | POA: Diagnosis present

## 2015-11-09 DIAGNOSIS — K7460 Unspecified cirrhosis of liver: Secondary | ICD-10-CM | POA: Diagnosis present

## 2015-11-09 DIAGNOSIS — Z7189 Other specified counseling: Secondary | ICD-10-CM | POA: Diagnosis not present

## 2015-11-09 DIAGNOSIS — R188 Other ascites: Secondary | ICD-10-CM | POA: Diagnosis present

## 2015-11-09 DIAGNOSIS — I10 Essential (primary) hypertension: Secondary | ICD-10-CM | POA: Diagnosis present

## 2015-11-09 DIAGNOSIS — N179 Acute kidney failure, unspecified: Secondary | ICD-10-CM | POA: Diagnosis present

## 2015-11-09 DIAGNOSIS — R634 Abnormal weight loss: Secondary | ICD-10-CM | POA: Diagnosis present

## 2015-11-09 DIAGNOSIS — K717 Toxic liver disease with fibrosis and cirrhosis of liver: Secondary | ICD-10-CM

## 2015-11-09 DIAGNOSIS — K659 Peritonitis, unspecified: Secondary | ICD-10-CM

## 2015-11-09 DIAGNOSIS — Z66 Do not resuscitate: Secondary | ICD-10-CM | POA: Diagnosis not present

## 2015-11-09 DIAGNOSIS — Z681 Body mass index (BMI) 19 or less, adult: Secondary | ICD-10-CM | POA: Diagnosis not present

## 2015-11-09 DIAGNOSIS — R64 Cachexia: Secondary | ICD-10-CM | POA: Diagnosis present

## 2015-11-09 DIAGNOSIS — I8500 Esophageal varices without bleeding: Secondary | ICD-10-CM | POA: Diagnosis present

## 2015-11-09 DIAGNOSIS — I959 Hypotension, unspecified: Secondary | ICD-10-CM | POA: Diagnosis present

## 2015-11-09 DIAGNOSIS — K5900 Constipation, unspecified: Secondary | ICD-10-CM | POA: Diagnosis not present

## 2015-11-09 DIAGNOSIS — R5381 Other malaise: Secondary | ICD-10-CM | POA: Diagnosis present

## 2015-11-09 DIAGNOSIS — Z79899 Other long term (current) drug therapy: Secondary | ICD-10-CM | POA: Diagnosis not present

## 2015-11-09 DIAGNOSIS — B1920 Unspecified viral hepatitis C without hepatic coma: Secondary | ICD-10-CM | POA: Diagnosis present

## 2015-11-09 DIAGNOSIS — Z79891 Long term (current) use of opiate analgesic: Secondary | ICD-10-CM

## 2015-11-09 DIAGNOSIS — E43 Unspecified severe protein-calorie malnutrition: Secondary | ICD-10-CM | POA: Diagnosis present

## 2015-11-09 DIAGNOSIS — R1110 Vomiting, unspecified: Secondary | ICD-10-CM | POA: Diagnosis not present

## 2015-11-09 DIAGNOSIS — R109 Unspecified abdominal pain: Secondary | ICD-10-CM | POA: Diagnosis present

## 2015-11-09 DIAGNOSIS — C228 Malignant neoplasm of liver, primary, unspecified as to type: Secondary | ICD-10-CM | POA: Diagnosis not present

## 2015-11-09 DIAGNOSIS — C220 Liver cell carcinoma: Secondary | ICD-10-CM | POA: Diagnosis present

## 2015-11-09 DIAGNOSIS — F17210 Nicotine dependence, cigarettes, uncomplicated: Secondary | ICD-10-CM | POA: Diagnosis present

## 2015-11-09 DIAGNOSIS — Z8619 Personal history of other infectious and parasitic diseases: Secondary | ICD-10-CM

## 2015-11-09 DIAGNOSIS — K652 Spontaneous bacterial peritonitis: Principal | ICD-10-CM | POA: Diagnosis present

## 2015-11-09 DIAGNOSIS — G4701 Insomnia due to medical condition: Secondary | ICD-10-CM

## 2015-11-09 DIAGNOSIS — K7469 Other cirrhosis of liver: Secondary | ICD-10-CM | POA: Diagnosis not present

## 2015-11-09 LAB — CBC
HCT: 34.3 % — ABNORMAL LOW (ref 39.0–52.0)
Hemoglobin: 11.5 g/dL — ABNORMAL LOW (ref 13.0–17.0)
MCH: 30.4 pg (ref 26.0–34.0)
MCHC: 33.5 g/dL (ref 30.0–36.0)
MCV: 90.7 fL (ref 78.0–100.0)
PLATELETS: 100 10*3/uL — AB (ref 150–400)
RBC: 3.78 MIL/uL — AB (ref 4.22–5.81)
RDW: 14.4 % (ref 11.5–15.5)
WBC: 3.6 10*3/uL — AB (ref 4.0–10.5)

## 2015-11-09 LAB — COMPREHENSIVE METABOLIC PANEL
ALT: 13 U/L — AB (ref 17–63)
AST: 18 U/L (ref 15–41)
Albumin: 2.6 g/dL — ABNORMAL LOW (ref 3.5–5.0)
Alkaline Phosphatase: 87 U/L (ref 38–126)
Anion gap: 8 (ref 5–15)
BILIRUBIN TOTAL: 2.8 mg/dL — AB (ref 0.3–1.2)
BUN: 42 mg/dL — AB (ref 6–20)
CALCIUM: 9.9 mg/dL (ref 8.9–10.3)
CHLORIDE: 101 mmol/L (ref 101–111)
CO2: 23 mmol/L (ref 22–32)
CREATININE: 1.79 mg/dL — AB (ref 0.61–1.24)
GFR, EST AFRICAN AMERICAN: 43 mL/min — AB (ref >60)
GFR, EST NON AFRICAN AMERICAN: 38 mL/min — AB (ref >60)
Glucose, Bld: 136 mg/dL — ABNORMAL HIGH (ref 65–99)
Potassium: 3.7 mmol/L (ref 3.5–5.1)
Sodium: 132 mmol/L — ABNORMAL LOW (ref 135–145)
TOTAL PROTEIN: 7.2 g/dL (ref 6.5–8.1)

## 2015-11-09 LAB — URINALYSIS, ROUTINE W REFLEX MICROSCOPIC
Glucose, UA: NEGATIVE mg/dL
Hgb urine dipstick: NEGATIVE
KETONES UR: NEGATIVE mg/dL
LEUKOCYTES UA: NEGATIVE
NITRITE: NEGATIVE
PROTEIN: NEGATIVE mg/dL
Specific Gravity, Urine: 1.017 (ref 1.005–1.030)
pH: 5.5 (ref 5.0–8.0)

## 2015-11-09 LAB — I-STAT CG4 LACTIC ACID, ED: LACTIC ACID, VENOUS: 1.64 mmol/L (ref 0.5–1.9)

## 2015-11-09 LAB — LIPASE, BLOOD: LIPASE: 39 U/L (ref 11–51)

## 2015-11-09 MED ORDER — SODIUM CHLORIDE 0.9 % IV SOLN
INTRAVENOUS | Status: DC
  Administered 2015-11-09: via INTRAVENOUS

## 2015-11-09 MED ORDER — IOPAMIDOL (ISOVUE-300) INJECTION 61%
75.0000 mL | Freq: Once | INTRAVENOUS | Status: AC | PRN
  Administered 2015-11-09: 75 mL via INTRAVENOUS

## 2015-11-09 MED ORDER — SODIUM CHLORIDE 0.9 % IV BOLUS (SEPSIS)
1000.0000 mL | Freq: Once | INTRAVENOUS | Status: AC
  Administered 2015-11-09: 1000 mL via INTRAVENOUS

## 2015-11-09 MED ORDER — CIPROFLOXACIN IN D5W 400 MG/200ML IV SOLN
400.0000 mg | Freq: Once | INTRAVENOUS | Status: AC
  Administered 2015-11-09: 400 mg via INTRAVENOUS
  Filled 2015-11-09: qty 200

## 2015-11-09 MED ORDER — HYDROMORPHONE HCL 1 MG/ML IJ SOLN
1.0000 mg | Freq: Once | INTRAMUSCULAR | Status: AC
  Administered 2015-11-09: 1 mg via INTRAVENOUS
  Filled 2015-11-09: qty 1

## 2015-11-09 MED ORDER — ONDANSETRON HCL 4 MG/2ML IJ SOLN
4.0000 mg | Freq: Once | INTRAMUSCULAR | Status: AC
  Administered 2015-11-09: 4 mg via INTRAVENOUS
  Filled 2015-11-09: qty 2

## 2015-11-09 MED ORDER — HYDROMORPHONE HCL 1 MG/ML IJ SOLN
0.5000 mg | Freq: Once | INTRAMUSCULAR | Status: AC
  Administered 2015-11-09: 0.5 mg via INTRAVENOUS
  Filled 2015-11-09: qty 1
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
## 2015-11-09 NOTE — ED Notes (Signed)
Bed: [id number] Expected date:  Expected time:  Means of arrival:  Comments: EMS-constipation

## 2015-11-09 NOTE — ED Triage Notes (Addendum)
Pt BIB [facility] from home c/o generalized abdominal pain that hurts more with palpation or lying down. Pt also complaining of constipation, nausea and vomiting. Pt states that symptoms began after his flu shot 4 days ago. A&Ox4. Ambulatory with a cane. Hx of Hep C

## 2015-11-09 NOTE — ED Notes (Signed)
In Ct  

## 2015-11-09 NOTE — ED Notes (Signed)
Pt sts generalized abd pain that started about 4-5 days ago.  Sts got flu shot last week and feels like this is related to that.  Pt sts about 4 episodes of vomiting per day and a couple of episodes of diarrhea over past couple of days.  NAD.  Pt is tender upon palpation in all 4 abd quads.

## 2015-11-10 DIAGNOSIS — R110 Nausea: Secondary | ICD-10-CM

## 2015-11-10 DIAGNOSIS — Z7189 Other specified counseling: Secondary | ICD-10-CM

## 2015-11-10 DIAGNOSIS — R188 Other ascites: Secondary | ICD-10-CM

## 2015-11-10 DIAGNOSIS — B1920 Unspecified viral hepatitis C without hepatic coma: Secondary | ICD-10-CM

## 2015-11-10 DIAGNOSIS — Z590 Homelessness: Secondary | ICD-10-CM

## 2015-11-10 DIAGNOSIS — C228 Malignant neoplasm of liver, primary, unspecified as to type: Secondary | ICD-10-CM

## 2015-11-10 DIAGNOSIS — C220 Liver cell carcinoma: Secondary | ICD-10-CM

## 2015-11-10 DIAGNOSIS — D649 Anemia, unspecified: Secondary | ICD-10-CM

## 2015-11-10 DIAGNOSIS — K652 Spontaneous bacterial peritonitis: Principal | ICD-10-CM

## 2015-11-10 DIAGNOSIS — Z515 Encounter for palliative care: Secondary | ICD-10-CM

## 2015-11-10 DIAGNOSIS — R109 Unspecified abdominal pain: Secondary | ICD-10-CM

## 2015-11-10 DIAGNOSIS — E43 Unspecified severe protein-calorie malnutrition: Secondary | ICD-10-CM | POA: Insufficient documentation

## 2015-11-10 DIAGNOSIS — R161 Splenomegaly, not elsewhere classified: Secondary | ICD-10-CM

## 2015-11-10 DIAGNOSIS — R112 Nausea with vomiting, unspecified: Secondary | ICD-10-CM

## 2015-11-10 DIAGNOSIS — R531 Weakness: Secondary | ICD-10-CM

## 2015-11-10 DIAGNOSIS — R64 Cachexia: Secondary | ICD-10-CM

## 2015-11-10 DIAGNOSIS — K7460 Unspecified cirrhosis of liver: Secondary | ICD-10-CM

## 2015-11-10 DIAGNOSIS — K717 Toxic liver disease with fibrosis and cirrhosis of liver: Secondary | ICD-10-CM

## 2015-11-10 DIAGNOSIS — K5900 Constipation, unspecified: Secondary | ICD-10-CM

## 2015-11-10 LAB — COMPREHENSIVE METABOLIC PANEL
ALT: 12 U/L — AB (ref 17–63)
AST: 18 U/L (ref 15–41)
Albumin: 2.1 g/dL — ABNORMAL LOW (ref 3.5–5.0)
Alkaline Phosphatase: 75 U/L (ref 38–126)
Anion gap: 6 (ref 5–15)
BUN: 39 mg/dL — ABNORMAL HIGH (ref 6–20)
CHLORIDE: 106 mmol/L (ref 101–111)
CO2: 22 mmol/L (ref 22–32)
CREATININE: 1.48 mg/dL — AB (ref 0.61–1.24)
Calcium: 9.2 mg/dL (ref 8.9–10.3)
GFR, EST AFRICAN AMERICAN: 55 mL/min — AB (ref >60)
GFR, EST NON AFRICAN AMERICAN: 47 mL/min — AB (ref >60)
Glucose, Bld: 152 mg/dL — ABNORMAL HIGH (ref 65–99)
POTASSIUM: 3.6 mmol/L (ref 3.5–5.1)
SODIUM: 134 mmol/L — AB (ref 135–145)
Total Bilirubin: 1.9 mg/dL — ABNORMAL HIGH (ref 0.3–1.2)
Total Protein: 6 g/dL — ABNORMAL LOW (ref 6.5–8.1)

## 2015-11-10 LAB — CBC
HEMATOCRIT: 24.6 % — AB (ref 39.0–52.0)
Hemoglobin: 8.5 g/dL — ABNORMAL LOW (ref 13.0–17.0)
MCH: 31.1 pg (ref 26.0–34.0)
MCHC: 34.6 g/dL (ref 30.0–36.0)
MCV: 90.1 fL (ref 78.0–100.0)
Platelets: 95 10*3/uL — ABNORMAL LOW (ref 150–400)
RBC: 2.73 MIL/uL — AB (ref 4.22–5.81)
RDW: 14.5 % (ref 11.5–15.5)
WBC: 3.8 10*3/uL — AB (ref 4.0–10.5)

## 2015-11-10 LAB — PROTIME-INR
INR: 1.16
Prothrombin Time: 14.9 seconds (ref 11.4–15.2)

## 2015-11-10 MED ORDER — PROPRANOLOL HCL 10 MG PO TABS
10.0000 mg | ORAL_TABLET | Freq: Two times a day (BID) | ORAL | Status: DC
  Filled 2015-11-10: qty 1

## 2015-11-10 MED ORDER — MORPHINE SULFATE ER 30 MG PO TBCR
30.0000 mg | EXTENDED_RELEASE_TABLET | Freq: Two times a day (BID) | ORAL | Status: DC
  Administered 2015-11-10 – 2015-11-14 (×10): 30 mg via ORAL
  Filled 2015-11-10 (×10): qty 1

## 2015-11-10 MED ORDER — ORAL CARE MOUTH RINSE
15.0000 mL | Freq: Two times a day (BID) | OROMUCOSAL | Status: DC
  Administered 2015-11-10 – 2015-11-13 (×5): 15 mL via OROMUCOSAL

## 2015-11-10 MED ORDER — ONDANSETRON HCL 4 MG PO TABS: 4.0000 mg | ORAL_TABLET | Freq: Four times a day (QID) | ORAL | Status: DC | PRN

## 2015-11-10 MED ORDER — SODIUM CHLORIDE 0.9% FLUSH: 3.0000 mL | INTRAVENOUS | Status: DC | PRN

## 2015-11-10 MED ORDER — SPIRONOLACTONE 25 MG PO TABS
50.0000 mg | ORAL_TABLET | Freq: Every day | ORAL | Status: DC
  Filled 2015-11-10 (×2): qty 2

## 2015-11-10 MED ORDER — SODIUM CHLORIDE 0.9% FLUSH
3.0000 mL | Freq: Two times a day (BID) | INTRAVENOUS | Status: DC
  Administered 2015-11-11 – 2015-11-14 (×4): 3 mL via INTRAVENOUS

## 2015-11-10 MED ORDER — TRAZODONE HCL 50 MG PO TABS
50.0000 mg | ORAL_TABLET | Freq: Every day | ORAL | Status: DC
  Administered 2015-11-10 – 2015-11-13 (×5): 50 mg via ORAL
  Filled 2015-11-10 (×5): qty 1

## 2015-11-10 MED ORDER — FUROSEMIDE 40 MG PO TABS
40.0000 mg | ORAL_TABLET | Freq: Every day | ORAL | Status: DC
  Filled 2015-11-10: qty 1

## 2015-11-10 MED ORDER — SODIUM CHLORIDE 0.9 % IV SOLN: 250.0000 mL | INTRAVENOUS | Status: DC | PRN

## 2015-11-10 MED ORDER — ENSURE ENLIVE PO LIQD
237.0000 mL | Freq: Two times a day (BID) | ORAL | Status: DC
  Administered 2015-11-10 – 2015-11-14 (×7): 237 mL via ORAL

## 2015-11-10 MED ORDER — ADULT MULTIVITAMIN W/MINERALS CH
1.0000 | ORAL_TABLET | Freq: Every day | ORAL | Status: DC
  Administered 2015-11-10 – 2015-11-14 (×5): 1 via ORAL
  Filled 2015-11-10 (×5): qty 1

## 2015-11-10 MED ORDER — ONDANSETRON HCL 4 MG/2ML IJ SOLN
4.0000 mg | Freq: Four times a day (QID) | INTRAMUSCULAR | Status: DC | PRN
  Administered 2015-11-14 (×2): 4 mg via INTRAVENOUS
  Filled 2015-11-10 (×2): qty 2

## 2015-11-10 MED ORDER — SODIUM CHLORIDE 0.9 % IV BOLUS (SEPSIS)
500.0000 mL | Freq: Once | INTRAVENOUS | Status: AC
  Administered 2015-11-10: 500 mL via INTRAVENOUS

## 2015-11-10 MED ORDER — CIPROFLOXACIN IN D5W 400 MG/200ML IV SOLN
400.0000 mg | Freq: Two times a day (BID) | INTRAVENOUS | Status: DC
  Administered 2015-11-10 – 2015-11-13 (×7): 400 mg via INTRAVENOUS
  Filled 2015-11-10 (×7): qty 200

## 2015-11-10 MED ORDER — PANTOPRAZOLE SODIUM 40 MG PO TBEC
40.0000 mg | DELAYED_RELEASE_TABLET | Freq: Every day | ORAL | Status: DC
  Administered 2015-11-10 – 2015-11-14 (×5): 40 mg via ORAL
  Filled 2015-11-10 (×5): qty 1

## 2015-11-10 MED ORDER — FOLIC ACID 1 MG PO TABS
1.0000 mg | ORAL_TABLET | Freq: Every day | ORAL | Status: DC
  Administered 2015-11-10 – 2015-11-14 (×5): 1 mg via ORAL
  Filled 2015-11-10 (×5): qty 1

## 2015-11-10 MED ORDER — CHLORHEXIDINE GLUCONATE 0.12 % MT SOLN
15.0000 mL | Freq: Two times a day (BID) | OROMUCOSAL | Status: DC
  Administered 2015-11-10 – 2015-11-14 (×8): 15 mL via OROMUCOSAL
  Filled 2015-11-10 (×8): qty 15

## 2015-11-10 MED ORDER — OXYCODONE HCL 5 MG PO TABS
10.0000 mg | ORAL_TABLET | Freq: Four times a day (QID) | ORAL | Status: DC | PRN
  Administered 2015-11-10 – 2015-11-14 (×14): 10 mg via ORAL
  Filled 2015-11-10 (×15): qty 2

## 2015-11-10 NOTE — Consult Note (Addendum)
Referral MD  Reason for Referral: Hepatocellular carcinoma in the setting of hepatitis C with cirrhosis   Chief Complaint  Patient presents with  . Abdominal Pain  . Constipation  : I got weak.  HPI: [name] is well-known to me. He is a 65 year old white male with hepatocellular carcinoma. This is in the setting of hepatitis see. He has cirrhosis.  He has a horrible living situation where he basically is homeless. He apparently has been staying on occasion with a daughter.  We see him every couple months. He has had intrahepatic therapy with I think chemotherapy embolization. He has gotten TACE therapy. His first treatment was in September 2016. Had a repeat procedure on 04/11/2015.  Of note, his alpha-fetoprotein has was been normal.  His last hepatitis C antigen level was 3.5 million.  Because of his overall social status, is not been able to see gastroenterology for treatment of the hepatitis C.  He has not been a candidate for systemic treatment of the hepatocellular carcinoma.  He was admitted on the 12th. It sounds like he diskette weak. He had a CT scan done. This showed progressive disease. The CT scan showed moderate to large amount of ascites. He had splenomegaly. He had varices. He had enlargement of a caudate lobe mass.  On his lab studies, his albumin is 2.1. His prealbumin about 3 weeks ago was 14. His blood sugars have been on the high side.  He is anemic. His hemoglobin was 8.5 today. White cell count 3.8. Platelet count 95,000.  His sounds like he still is living in a tent. He has been at a daughter's house but this sounds like he just "visits".  He is quite emaciated. It is hard to say how much he really eats. He has had some nausea but no vomiting. He denies any obvious bleeding.      He has had no obvious fever.  There is been no leg swelling.  He has had no rashes.  Overall, his performance status is ECOG 3-4.   Past Medical History:  Diagnosis Date  .  Absolute anemia 09/26/2008  . Anxiety   . Arthritis   . Blood transfusion without reported diagnosis   . Cancer ([place])   . Cataract   . Cirrhosis ([place])   . Depression   . GERD (gastroesophageal reflux disease)   . GI bleed   . Hypertension   . Recurrent hepatocellular carcinoma ([place]) 04/28/2015  . Renal disorder   . Substance abuse   . Tuberculosis   :  Past Surgical History:  Procedure Laterality Date  . ABDOMINAL SURGERY     band around gut at [facility]  . EYE SURGERY  2013     Bilateral cataract surgery    . IR GENERIC HISTORICAL  06/13/2015   IR RADIOLOGIST EVAL & MGMT 06/13/2015 Jacqulynn Cadet, MD [facility] INTERV RAD  . THROAT SURGERY    :   Current Facility-Administered Medications:  .  0.9 %  sodium chloride infusion, 250 mL, Intravenous, PRN, Phillips Grout, MD .  chlorhexidine (PERIDEX) 0.12 % solution 15 mL, 15 mL, Mouth Rinse, BID, Hosie Poisson, MD, 15 mL at 11/10/15 0949 .  ciprofloxacin (CIPRO) IVPB 400 mg, 400 mg, Intravenous, Q12H, Phillips Grout, MD, 400 mg at 11/10/15 0948 .  feeding supplement (ENSURE ENLIVE) (ENSURE ENLIVE) liquid 237 mL, 237 mL, Oral, BID BM, Hosie Poisson, MD, 237 mL at 11/10/15 1705 .  folic acid (FOLVITE) tablet 1 mg, 1 mg, Oral,  Daily, Phillips Grout, MD, 1 mg at 11/10/15 0950 .  furosemide (LASIX) tablet 40 mg, 40 mg, Oral, Daily, Phillips Grout, MD .  MEDLINE mouth rinse, 15 mL, Mouth Rinse, q12n4p, Hosie Poisson, MD, 15 mL at 11/10/15 1705 .  morphine (MS CONTIN) 12 hr tablet 30 mg, 30 mg, Oral, Q12H, Phillips Grout, MD, 30 mg at 11/10/15 0949 .  multivitamin with minerals tablet 1 tablet, 1 tablet, Oral, Daily, Hosie Poisson, MD, 1 tablet at 11/10/15 1705 .  ondansetron (ZOFRAN) tablet 4 mg, 4 mg, Oral, Q6H PRN **OR** ondansetron (ZOFRAN) injection 4 mg, 4 mg, Intravenous, Q6H PRN, Phillips Grout, MD .  oxyCODONE (Oxy IR/ROXICODONE) immediate release tablet 10 mg, 10 mg, Oral, Q6H PRN, Phillips Grout, MD, 10 mg at 11/10/15 1128 .   pantoprazole (PROTONIX) EC tablet 40 mg, 40 mg, Oral, Daily, Phillips Grout, MD, 40 mg at 11/10/15 0949 .  propranolol (INDERAL) tablet 10 mg, 10 mg, Oral, BID, Phillips Grout, MD .  sodium chloride flush (NS) 0.9 % injection 3 mL, 3 mL, Intravenous, Q12H, Rachal A David, MD .  sodium chloride flush (NS) 0.9 % injection 3 mL, 3 mL, Intravenous, PRN, Phillips Grout, MD .  spironolactone (ALDACTONE) tablet 50 mg, 50 mg, Oral, Daily, Phillips Grout, MD .  traZODone (DESYREL) tablet 50 mg, 50 mg, Oral, QHS, Phillips Grout, MD, 50 mg at 11/10/15 0150:     Social History  . Marital status: Divorced    Spouse name: N/A  . Number of children: N/A  . Years of education: N/A   Occupational History  . Not on file.   Social History Main Topics  . Smoking status: Current Every Day Smoker    Packs/day: 0.75    Years: 49.00    Types: Cigarettes    Start date: 03/20/1973  . Smokeless tobacco: Never Used     Comment: 08-18-14  still smoking  . Alcohol use 0.0 oz/week  . Drug use: No  . Sexual activity: Not on file   Other Topics Concern  . Not on file   Social History Narrative  . No narrative on file  :  Pertinent items are noted in HPI.  Exam: Patient Vitals for the past 24 hrs:  BP Temp Temp src Pulse Resp SpO2 Height Weight  11/10/15 1454 (!) 81/60 98.5 F (36.9 C) Oral (!) 56 18 98 % - -  11/10/15 1400 (!) 80/60 - - 60 16 - - -  11/10/15 0801 (!) 78/53 - - (!) 53 16 - - -  11/10/15 0704 (!) 77/58 - - (!) 44 - - - -  11/10/15 0614 (!) 84/54 98.2 F (36.8 C) Oral (!) 48 16 100 % - -  11/10/15 0100 (!) 107/58 98 F (36.7 C) Oral 74 18 97 % 5\' 5"  (1.651 m) 106 lb 6.4 oz (48.3 kg)  11/10/15 0000 (!) 87/59 98.1 F (36.7 C) Oral (!) 54 18 97 % - -  11/09/15 2130 90/59 - - 60 18 96 % - -  11/09/15 2100 101/55 - - (!) 51 15 98 % - -  11/09/15 2055 105/59 - - (!) 51 16 99 % - -  11/09/15 2030 110/63 - - (!) 49 15 99 % - -  11/09/15 2000 104/65 - - (!) 50 18 100 % - -  11/09/15 1930  102/59 - - (!) 49 13 100 % - -  11/09/15 1900 101/57 - - Marland Kitchen)  51 22 99 % - -  11/09/15 1845 114/64 98.1 F (36.7 C) Oral (!) 59 18 100 % - -    As above    Recent Labs  11/09/15 1917 11/10/15 0420  WBC 3.6* 3.8*  HGB 11.5* 8.5*  HCT 34.3* 24.6*  PLT 100* 95*    Recent Labs  11/09/15 1917 11/10/15 0420  NA 132* 134*  K 3.7 3.6  CL 101 106  CO2 23 22  GLUCOSE 136* 152*  BUN 42* 39*  CREATININE 1.79* 1.48*  CALCIUM 9.9 9.2    Blood smear review:  None  Pathology: None     Assessment and Plan:  Mr. Letbetter is a 65 year old white male. He has hepatitis see. He has cirrhosis. He has progressive hepatocellular carcinoma.  Again, he is not a candidate for any systemic therapy from my point of view. He just is too weak. He is just feeble.  I really think that at this point, we have to focus on quality of life. He really needs to have a place to stay.  I appreciate palliative care seeing him. He really needs to have the focus of his care be on palliation.  I think he lives in [place]. I was wondering if he might be able to go to the hospice home in [facility]. If not there, I was wondering if he might be able to go to [facility] in [place].  I really think that his prognosis will be no more than 4-6 weeks. He is declining. He has progression of his cancer. He has hepatitis C which has not been treated nor will be treated. He has  ascites. I'll have to say that his Child's class is probably "C".  I ttalked to him today about all this. I am pretty sure that he understands. We really have to focus on his quality of life. We had to focus on respect and dignity. However we can provide this for him, I think this would be the focus. He served our country in [employer]. As such, we really need to make sure that he has that respect and comfort and quality of life. He needs a place to stay for the last few weeks of his life.  Although he is quite anemic, I probably would hold off  on transfusing him right now.  He does not look all that uncomfortable with the ascites so I don't think that we have to do a paracentesis right now.  I spent about 45 minutes with him. I will follow him along.  Lattie Haw, MD
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
## 2015-11-10 NOTE — Progress Notes (Signed)
ultasound unable to do paracentesis due to very low blood pressures.

## 2015-11-10 NOTE — Clinical Social Work Note (Signed)
Clinical Social Work Assessment  Patient Details  Name: Antonio Fleming MRN: [id number] Date of Birth: [birth year]  Date of referral:  11/10/15               Reason for consult:  Housing Concerns/Homelessness                Permission sought to share information with:  Family Supports Permission granted to share information::  Yes, Verbal Permission Granted  Name::     Caryl Asp  Agency::     Relationship::  daughter  Contact Information:  [phone]  Housing/Transportation Living arrangements for the past 2 months:  Homeless (intermittently staying with daughter) Source of Information:  Patient Patient Interpreter Needed:  None Criminal Activity/Legal Involvement Pertinent to Current Situation/Hospitalization:  No - Comment as needed Significant Relationships:  Adult Children Lives with:  Self, Adult Children Do you feel safe going back to the place where you live?  No Need for family participation in patient care:  Yes (Comment)  Care giving concerns:  Homeless, unstable housing   Facilities manager / plan:  CSW met with pt alone.  CSW introduced self and explained the role of CSW.  CSW inquired about pt's housing status.  Pt stated he has been living with his daughter and during the weekends he has been living in a tent.  Pt stated he has applied for a HUD house in August 2017 but he has not been notified of his status.  Pt stated he will discharge to his daughter's house.  CSW gave pt information for local shelters and a list of places to get free meals.  CSW signing off as there are no further needs at this time.  Employment status:  Retired Nurse, adult PT Recommendations:  Not assessed at this time Information / Referral to community resources:  Shelter  Patient/Family's Response to care:  Pt was appreciative of CSW assistance.  Patient/Family's Understanding of and Emotional Response to Diagnosis, Current Treatment, and Prognosis:  Pt  stated his daughter is planning to meet with medical staff to complete a living will with pt.  He is hopeful that he will soon qualify for government housing.  Emotional Assessment Appearance:  Appears older than stated age Attitude/Demeanor/Rapport:  Other (cooperative) Affect (typically observed):  Accepting, Calm, Pleasant Orientation:  Oriented to Self, Oriented to Place, Oriented to  Time, Oriented to Situation Alcohol / Substance use:  Not Applicable Psych involvement (Current and /or in the community):  No (Comment)  Discharge Needs  Concerns to be addressed:  Homelessness Readmission within the last 30 days:  No Current discharge risk:  Homeless Barriers to Discharge:  Continued Medical Work up   [place], [place], LCSW 11/10/2015, 3:57 PM
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
## 2015-11-10 NOTE — ED Notes (Signed)
Report called hospitalist in room
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
## 2015-11-11 ENCOUNTER — Telehealth: Payer: Self-pay | Admitting: Medical

## 2015-11-11 ENCOUNTER — Inpatient Hospital Stay (HOSPITAL_COMMUNITY): Payer: Medicare HMO

## 2015-11-11 DIAGNOSIS — K7469 Other cirrhosis of liver: Secondary | ICD-10-CM

## 2015-11-11 DIAGNOSIS — Z7189 Other specified counseling: Secondary | ICD-10-CM

## 2015-11-11 DIAGNOSIS — R1110 Vomiting, unspecified: Secondary | ICD-10-CM

## 2015-11-11 DIAGNOSIS — G894 Chronic pain syndrome: Secondary | ICD-10-CM

## 2015-11-11 DIAGNOSIS — Z515 Encounter for palliative care: Secondary | ICD-10-CM

## 2015-11-11 LAB — BODY FLUID CELL COUNT WITH DIFFERENTIAL
Lymphs, Fluid: 4 %
Monocyte-Macrophage-Serous Fluid: 17 % — ABNORMAL LOW (ref 50–90)
NEUTROPHIL FLUID: 79 % — AB (ref 0–25)
WBC FLUID: 458 uL (ref 0–1000)

## 2015-11-11 LAB — GRAM STAIN

## 2015-11-11 NOTE — Procedures (Signed)
Successful US guided paracentesis from right lateral abdomen.  Yielded 2 of clear yellow fluid.  No immediate complications.  Pt tolerated well.   Specimen was sent for labs.  WENDY S BLAIR PA-C 11/11/2015 11:00 AM

## 2015-11-11 NOTE — Telephone Encounter (Signed)
Would you review Dr. Marin Olp last note. I think I have seen pt one time and he has never followed up with me. But if you review Dr. Marin Olp note his situation is quite sad. He is homeless  lives in a tent per Dr. Marin Olp. Pt has terminal diagnosis. Would [facility] be helpful in this situation?? Let me know what you think. Pt was just admitted yesterday. Would you call pt when he is discharged and offer follow up. Maybe get home health/hospice visit him(would they go to his tent??). Maybe his daughter could come to his follow up visit with me??
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
## 2015-11-11 NOTE — Progress Notes (Signed)
PROGRESS NOTE    Antonio Fleming  [id number] DOB: 12-16-1950 DOA: 11/09/2015 PCP: Mackie Pai, PA-C    Brief Narrative: Antonio Fleming is a 65 y.o. male with medical history significant of hepatocellular cancer of liver, hep c, cirrhosis, htn is on chronic opiates for the past year, presents with nausea , vomiting and abdominal pain. He was found to have ascites and was hypotensive.  He was admitted for SBP and admitted for US guided paracentesis.    Assessment & Plan:   Principal Problem:   Abdominal pain with vomiting Active Problems:   Cirrhosis of liver (HCC)   Hx of hepatitis C   Chronic pain syndrome   Esophageal varices (HCC)   Recurrent hepatocellular carcinoma (HCC)   SBP (spontaneous bacterial peritonitis) (HCC)   Nausea and vomiting   Protein-calorie malnutrition, severe   Palliative care encounter   Goals of care, counseling/discussion   Hepatocellular cancer / hepatitis C :  No further treatment as per Dr Marin Olp.   Chronic pain Syndrome: Resume home meds.   SBP: Started on IV ciprofloxacin.   Liver cirrhosis/ Ascites:  US guided paracentesis done today and 2 litres of fluid taken out.  BP borderline.  Nausea, vomiting and abdominal pain:  Improved with symptomatic management.    Cachetic looking man, deconditioned, failure to thrive, progression of liver carcinoma, not a candidate for further treatment: - palliative care consulted, currently awaiting to hear from the daughter    Anemia of chronic disease: Baseline hemoglobin around 8.    Acute renal failure: Improving, with gentle hydration.      DVT prophylaxis:scd's Code Status: DNR.  Family Communication: None at bedside.  Disposition Plan: pending palliative evaluation. .   Consultants:   Oncology  Palliative care.    Procedures: US guided paracentesis.    Antimicrobials: IV ciprofloxacin.    Subjective: Pain and lower back pain.   Objective: Vitals:   11/11/15  0847 11/11/15 0945 11/11/15 1005 11/11/15 1415  BP: (!) 97/50 (!) 91/52 (!) 88/58 (!) 90/54  Pulse:    64  Resp:    12  Temp:    98 F (36.7 C)  TempSrc:    Oral  SpO2:    100%  Weight:      Height:        Intake/Output Summary (Last 24 hours) at 11/11/15 1614 Last data filed at 11/11/15 1608  Gross per 24 hour  Intake             2012 ml  Output              650 ml  Net             1362 ml   Filed Weights   11/10/15 0100  Weight: 48.3 kg (106 lb 6.4 oz)    Examination:  General exam: Appears calm and comfortable , disheveled , cachetic looking man.  Respiratory system: Clear to auscultation. Respiratory effort normal. Cardiovascular system: S1 & S2 heard, RRR. No JVD, murmurs, rubs, gallops or clicks. No pedal edema. Gastrointestinal system: abdomen distended, soft, mild gen tenderness.  Central nervous system: Alert and oriented. No focal neurological deficits. Extremities: Symmetric 5 x 5 power.     Data Reviewed: I have personally reviewed following labs and imaging studies  CBC:  Recent Labs Lab 11/09/15 1917 11/10/15 0420  WBC 3.6* 3.8*  HGB 11.5* 8.5*  HCT 34.3* 24.6*  MCV 90.7 90.1  PLT 100* 95*   Basic Metabolic Panel:  Recent Labs  Lab 11/09/15 1917 11/10/15 0420  NA 132* 134*  K 3.7 3.6  CL 101 106  CO2 23 22  GLUCOSE 136* 152*  BUN 42* 39*  CREATININE 1.79* 1.48*  CALCIUM 9.9 9.2   GFR: Estimated Creatinine Clearance: 33.1 mL/min (by C-G formula based on SCr of 1.48 mg/dL (H)). Liver Function Tests:  Recent Labs Lab 11/09/15 1917 11/10/15 0420  AST 18 18  ALT 13* 12*  ALKPHOS 87 75  BILITOT 2.8* 1.9*  PROT 7.2 6.0*  ALBUMIN 2.6* 2.1*    Recent Labs Lab 11/09/15 1917  LIPASE 39   No results for input(s): AMMONIA in the last 168 hours. Coagulation Profile:  Recent Labs Lab 11/10/15 0420  INR 1.16   Cardiac Enzymes: No results for input(s): CKTOTAL, CKMB, CKMBINDEX, TROPONINI in the last 168 hours. BNP (last 3  results) No results for input(s): PROBNP in the last 8760 hours. HbA1C: No results for input(s): HGBA1C in the last 72 hours. CBG: No results for input(s): GLUCAP in the last 168 hours. Lipid Profile: No results for input(s): CHOL, HDL, LDLCALC, TRIG, CHOLHDL, LDLDIRECT in the last 72 hours. Thyroid Function Tests: No results for input(s): TSH, T4TOTAL, FREET4, T3FREE, THYROIDAB in the last 72 hours. Anemia Panel: No results for input(s): VITAMINB12, FOLATE, FERRITIN, TIBC, IRON, RETICCTPCT in the last 72 hours. Sepsis Labs:  Recent Labs Lab 11/09/15 2024  LATICACIDVEN 1.64    Recent Results (from the past 240 hour(s))  Gram stain     Status: None   Collection Time: 11/11/15 10:00 AM  Result Value Ref Range Status   Specimen Description FLUID ASCITIC  Final   Special Requests NONE  Final   Gram Stain   Final    ABUNDANT WBC PRESENT,BOTH PMN AND MONONUCLEAR NO ORGANISMS SEEN    Report Status 11/11/2015 FINAL  Final         Radiology Studies: Ct Abdomen Pelvis W Contrast  Result Date: 11/09/2015 CLINICAL DATA:  Nausea and vomiting for 4-5 days. Generalized abdominal pain. EXAM: CT ABDOMEN AND PELVIS WITH CONTRAST TECHNIQUE: Multidetector CT imaging of the abdomen and pelvis was performed using the standard protocol following bolus administration of intravenous contrast. CONTRAST:  67mL ISOVUE-300 IOPAMIDOL (ISOVUE-300) INJECTION 61% COMPARISON:  MRI 09/30/2015, CT scan 03/13/2015 FINDINGS: Lower chest: Multiple tiny calcified lung nodules are again present at the bilateral lung bases. There is no acute infiltrate, consolidation, or pleural effusion identified. Heart size is nonenlarged. No significant pericardial effusion at the lung base. Hepatobiliary: Liver is cirrhotic. Stable 8 mm hyperenhancing nodule in segment 5 compared to recent MRI. Interval increase in size of the peripherally enhancing and necrotic mass in the caudate lobe, this measures 6.6 x 4.6 cm, compared with  5.3 x 5.4 cm previously. The mass mildly compresses the intra hepatic IVC. No new additional lesions are visualized. High density sludge and calcified stones are again present within the gallbladder. Common bile duct is dilated measuring 1 cm at the head of the pancreas. A small amount of high density debris is present within the distal bile duct at the head of the pancreas, similar compared to previous CT scan. No intra hepatic biliary dilatation. Of note, a stone was noted on MRI in the distal CBD. Pancreas: Pancreas is atrophic.  No pancreatic ductal dilatation. Spleen: The spleen is enlarged. Previously noted MRI a lesion in the spleen is not well appreciated on current CT. Adrenals/Urinary Tract: Bilateral adrenal glands are within normal limits. Multiple hypodense lesions within both kidneys, unchanged,  some of these would be compatible with cysts. No hydronephrosis. Stomach/Bowel: Mild gastric wall thickening but stomach is decompressed. No dilated small bowel to suggest effusion. Mild bowel wall thickening of the colon. Vascular/Lymphatic: Atherosclerotic vascular disease of the aorta. Portal vein is patent. Splenic vein is also patent. Multiple tortuous perigastric varices. Periportal lymph nodes, measuring up to 1 cm as before. Reproductive: Prostate gland contains coarse calcification. Other: Moderate to large amount of ascites. There is mild smooth peritoneal enhancement with enhancement noted around right upper quadrant ascites, and also within the left hemi abdomen. A few septations are present within ascites fluid in the right upper quadrant. Incidentally noted are tiny air bubbles in the right atrium likely he iatrogenic. No free air. Musculoskeletal: Bones are osteopenic with multiple mild compression deformities as before. IMPRESSION: 1. Cirrhotic liver with findings compatible with portal hypertension, including splenomegaly and upper abdominal varices. Moderate to large amount of ascites ; there  is smooth peritoneal enhancement which can be seen with peritonitis. 2. Interval enlargement of caudate lobe HCC since the previous MRI. No change in an additional segment 5 hyperdense mass lesion. 3. Gallstones. Stable dilatation of the extrahepatic common bile duct with hyperdense material or stones in the distal common bile duct. 4. Mild colon wall thickening suggests nonspecific bowel wall edema. Electronically Signed   By: Donavan Foil M.D.   On: 11/09/2015 23:03   US Paracentesis  Result Date: 11/11/2015 INDICATION: Cirrhosis, hepatocellular carcinoma with history of chemoembolization, ascites, request for diagnostic and therapeutic paracentesis. EXAM: ULTRASOUND GUIDED RIGHT LATERAL ABDOMEN PARACENTESIS MEDICATIONS: 1% Lidocaine. COMPLICATIONS: None immediate. PROCEDURE: Informed written consent was obtained from the patient after a discussion of the risks, benefits and alternatives to treatment. A timeout was performed prior to the initiation of the procedure. Initial ultrasound scanning demonstrates a large amount of ascites within the right lateral abdomen. The right lateral abdomen was prepped and draped in the usual sterile fashion. 1% lidocaine with epinephrine was used for local anesthesia. Following this, a 19 gauge, 7-cm, Yueh catheter was introduced. An ultrasound image was saved for documentation purposes. The paracentesis was performed. The catheter was removed and a dressing was applied. The patient tolerated the procedure well without immediate post procedural complication. FINDINGS: A total of approximately 2 liters of clear yellow fluid was removed. Samples were sent to the laboratory as requested by the clinical team. IMPRESSION: Successful ultrasound-guided paracentesis yielding 2 liters of peritoneal fluid. Read by:  Gareth Eagle, PA-C Electronically Signed   By: Corrie Mckusick D.O.   On: 11/11/2015 10:59        Scheduled Meds: . chlorhexidine  15 mL Mouth Rinse BID  .  ciprofloxacin  400 mg Intravenous Q12H  . feeding supplement (ENSURE ENLIVE)  237 mL Oral BID BM  . folic acid  1 mg Oral Daily  . mouth rinse  15 mL Mouth Rinse q12n4p  . morphine  30 mg Oral Q12H  . multivitamin with minerals  1 tablet Oral Daily  . pantoprazole  40 mg Oral Daily  . sodium chloride flush  3 mL Intravenous Q12H  . traZODone  50 mg Oral QHS   Continuous Infusions:    LOS: 2 days    Time spent: 30 minutes    Calleigh Lafontant, MD [facility] Pager [phone]  If 7PM-7AM, please contact night-coverage [email or web address] Password [email or web address] 11/11/2015, 4:14 PM

## 2015-11-12 LAB — COMPREHENSIVE METABOLIC PANEL
ALK PHOS: 74 U/L (ref 38–126)
ALT: 13 U/L — AB (ref 17–63)
AST: 19 U/L (ref 15–41)
Albumin: 2.2 g/dL — ABNORMAL LOW (ref 3.5–5.0)
Anion gap: 4 — ABNORMAL LOW (ref 5–15)
BUN: 29 mg/dL — AB (ref 6–20)
CALCIUM: 9.4 mg/dL (ref 8.9–10.3)
CO2: 22 mmol/L (ref 22–32)
CREATININE: 1.62 mg/dL — AB (ref 0.61–1.24)
Chloride: 104 mmol/L (ref 101–111)
GFR, EST AFRICAN AMERICAN: 49 mL/min — AB (ref >60)
GFR, EST NON AFRICAN AMERICAN: 42 mL/min — AB (ref >60)
Glucose, Bld: 171 mg/dL — ABNORMAL HIGH (ref 65–99)
Potassium: 4.3 mmol/L (ref 3.5–5.1)
Sodium: 130 mmol/L — ABNORMAL LOW (ref 135–145)
Total Bilirubin: 1.3 mg/dL — ABNORMAL HIGH (ref 0.3–1.2)
Total Protein: 5.9 g/dL — ABNORMAL LOW (ref 6.5–8.1)

## 2015-11-12 LAB — CBC
HEMATOCRIT: 23.2 % — AB (ref 39.0–52.0)
HEMOGLOBIN: 8.1 g/dL — AB (ref 13.0–17.0)
MCH: 30.9 pg (ref 26.0–34.0)
MCHC: 34.9 g/dL (ref 30.0–36.0)
MCV: 88.5 fL (ref 78.0–100.0)
Platelets: 86 10*3/uL — ABNORMAL LOW (ref 150–400)
RBC: 2.62 MIL/uL — AB (ref 4.22–5.81)
RDW: 14.5 % (ref 11.5–15.5)
WBC: 5 10*3/uL (ref 4.0–10.5)

## 2015-11-12 LAB — PROTIME-INR
INR: 1.32
PROTHROMBIN TIME: 16.4 s — AB (ref 11.4–15.2)

## 2015-11-12 MED ORDER — OXYCODONE HCL 5 MG PO TABS
5.0000 mg | ORAL_TABLET | Freq: Once | ORAL | Status: AC
  Administered 2015-11-12: 5 mg via ORAL
  Filled 2015-11-12: qty 1

## 2015-11-12 NOTE — Progress Notes (Signed)
Antonio Fleming reports that his daughter will be coming from [facility] tomorrow. I will plan to meet with family tomorrow in order to talk about long-term goals moving forward.  No charge note.  Micheline Rough, MD [place] Team [phone]

## 2015-11-12 NOTE — Progress Notes (Signed)
Pt was sitting up and awake in bed when I arrived. His ex-fiance was bedside. Pt said he was feeling so-so today when asked, but was still open to our visit. Pt talked about his family, mentioning two (or 3) daughters. He spoke of Nira Conn and a daughter who is on her way here from [place]. Pt said he wanted to talk about a living will. I described to him what a living will is and ascertained that he wants a will to protect his possessions. He asked for a "cheap" lawyer referral. I told him I could not refer anyone and mentioned Legal Aid which he has used in the past. Pt said he daughter will be admitted as a pt tonight into the hospital to have surgery on her hand for several broken bones. He said he has spoken to her since his hospitalization. However, he also expressed his wishes that she would not get his possessions and wants to name someone else in his will. Pt also asked for prayer for himself and his ex-fiance.  Please page if additional assistance is needed. [place], M.Div.   11/12/15 1000  Clinical Encounter Type  Visited With Patient and family together
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
## 2015-11-13 LAB — PATHOLOGIST SMEAR REVIEW

## 2015-11-13 MED ORDER — SODIUM CHLORIDE 0.9 % IV BOLUS (SEPSIS)
500.0000 mL | Freq: Once | INTRAVENOUS | Status: AC
  Administered 2015-11-13: 500 mL via INTRAVENOUS

## 2015-11-13 MED ORDER — CIPROFLOXACIN HCL 500 MG PO TABS
500.0000 mg | ORAL_TABLET | Freq: Two times a day (BID) | ORAL | Status: DC
  Administered 2015-11-13 – 2015-11-14 (×2): 500 mg via ORAL
  Filled 2015-11-13 (×2): qty 1

## 2015-11-13 NOTE — NC FL2 (Signed)
[place] LEVEL OF CARE SCREENING TOOL     IDENTIFICATION  Patient Name: Antonio Fleming Birthdate: 05-26-1950 Sex: male Admission Date (Current Location): 11/09/2015  [facility] and [place] Number:  Herbalist and Address:   [facility],  [place] [address] [place]      Provider Number: [id number]  Attending Physician Name and Address:  Hosie Poisson, MD  Relative Name and Phone Number:       Current Level of Care: SNF Recommended Level of Care: [place] Prior Approval Number:    Date Approved/Denied:   PASRR Number:    Discharge Plan: SNF    Current Diagnoses: Patient Active Problem List   Diagnosis Date Noted  . Palliative care encounter   . Goals of care, counseling/discussion   . Protein-calorie malnutrition, severe 11/10/2015  . SBP (spontaneous bacterial peritonitis) ([place]) 11/09/2015  . Nausea and vomiting 11/09/2015  . Abdominal pain with vomiting 11/09/2015  . Recurrent hepatocellular carcinoma ([place]) 04/28/2015  . Cirrhosis of liver ([place]) 08/03/2014  . Hx of hepatitis C 08/03/2014  . Liver cancer ([place]) 08/03/2014  . Chronic pain syndrome 08/03/2014  . Clinical depression 01/03/2014  . Anxiety disorder 11/29/2013  . Type 2 diabetes mellitus ([place]) 07/04/2012  . Nondependent alcohol abuse 07/04/2012  . Esophageal varices ([place]) 07/04/2012  . Duodenitis 07/13/2009  . Barrett esophagus 11/23/2008  . Blood in feces 11/15/2008  . Thrombocytopenia ([place]) 10/10/2008  . Abdominal dropsy 10/10/2008  . Current tobacco use 09/28/2008  . Absolute anemia 09/26/2008    Orientation RESPIRATION BLADDER Height & Weight     Self, Time, Situation, Place  Normal Continent Weight: 106 lb 6.4 oz (48.3 kg) Height:  5\' 5"  (165.1 cm)  BEHAVIORAL SYMPTOMS/MOOD NEUROLOGICAL BOWEL NUTRITION STATUS      Continent Diet  AMBULATORY STATUS COMMUNICATION OF NEEDS Skin   Limited Assist Verbally Skin abrasions (Left Arm)                        Personal Care Assistance Level of Assistance  Bathing, Feeding, Dressing Bathing Assistance: Limited assistance Feeding assistance: Independent Dressing Assistance: Limited assistance     Functional Limitations Info  Sight, Hearing, Speech Sight Info: Adequate Hearing Info: Adequate Speech Info: Adequate    SPECIAL CARE FACTORS FREQUENCY  PT (By licensed PT), OT (By licensed OT)     PT Frequency: 5              Contractures Contractures Info: Not present    Additional Factors Info  Code Status, Allergies Code Status Info: DNR Allergies Info: Penicillins, Tylenol Acetaminophen, Codeine           Current Medications (11/13/2015):  This is the current hospital active medication list Current Facility-Administered Medications  Medication Dose Route Frequency Provider Last Rate Last Dose  . 0.9 %  sodium chloride infusion  250 mL Intravenous PRN Phillips Grout, MD      . chlorhexidine (PERIDEX) 0.12 % solution 15 mL  15 mL Mouth Rinse BID Hosie Poisson, MD   15 mL at 11/13/15 1053  . ciprofloxacin (CIPRO) IVPB 400 mg  400 mg Intravenous Q12H Phillips Grout, MD   400 mg at 11/13/15 1053  . feeding supplement (ENSURE ENLIVE) (ENSURE ENLIVE) liquid 237 mL  237 mL Oral BID BM Hosie Poisson, MD   237 mL at 11/13/15 1053  . folic acid (FOLVITE) tablet 1 mg  1 mg Oral Daily Phillips Grout, MD  1 mg at 11/13/15 1053  . MEDLINE mouth rinse  15 mL Mouth Rinse q12n4p Hosie Poisson, MD   15 mL at 11/12/15 1600  . morphine (MS CONTIN) 12 hr tablet 30 mg  30 mg Oral Q12H Phillips Grout, MD   30 mg at 11/13/15 1053  . multivitamin with minerals tablet 1 tablet  1 tablet Oral Daily Hosie Poisson, MD   1 tablet at 11/13/15 1053  . ondansetron (ZOFRAN) tablet 4 mg  4 mg Oral Q6H PRN Phillips Grout, MD       Or  . ondansetron [facility]) injection 4 mg  4 mg Intravenous Q6H PRN Phillips Grout, MD      . oxyCODONE (Oxy IR/ROXICODONE) immediate release tablet 10 mg  10 mg Oral  Q6H PRN Phillips Grout, MD   10 mg at 11/13/15 0440  . pantoprazole (PROTONIX) EC tablet 40 mg  40 mg Oral Daily Phillips Grout, MD   40 mg at 11/13/15 1053  . sodium chloride flush (NS) 0.9 % injection 3 mL  3 mL Intravenous Q12H Phillips Grout, MD   3 mL at 11/13/15 1054  . sodium chloride flush (NS) 0.9 % injection 3 mL  3 mL Intravenous PRN Phillips Grout, MD      . traZODone (DESYREL) tablet 50 mg  50 mg Oral QHS Phillips Grout, MD   50 mg at 11/12/15 2114     Discharge Medications: Please see discharge summary for a list of discharge medications.  Relevant Imaging Results:  Relevant Lab Results:   Additional Information SS#[id number]  Lia Hopping, LCSW
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
## 2015-11-13 NOTE — Evaluation (Signed)
Physical Therapy Evaluation Patient Details Name: Antonio Fleming MRN: [id number] DOB: [birth year] Today's Date: 11/13/2015   History of Present Illness  65 yo male admitted with abd pain. hx of chronic pain, cirrhosis, HTN, hepatocellular carcinoma, substance abuse.  Clinical Impression  On eval, pt required Min assist for mobility. He walked ~20 feet with a RW. Pt is generally weak and he fatigues easily. He participated well with therapy session. Recommend  rehab at SNF.     Follow Up Recommendations SNF    Equipment Recommendations  Rolling walker with 5" wheels    Recommendations for Other Services       Precautions / Restrictions Precautions Precautions: Fall Restrictions Weight Bearing Restrictions: No      Mobility  Bed Mobility Overal bed mobility: Needs Assistance Bed Mobility: Supine to Sit     Supine to sit: Min guard;HOB elevated     General bed mobility comments: close guard for safety. Increased time.  Transfers Overall transfer level: Needs assistance Equipment used: Rolling walker (2 wheeled) Transfers: Sit to/from Stand Sit to Stand: Min assist         General transfer comment: Assist to rise, stabilize, control descent.   Ambulation/Gait Ambulation/Gait assistance: Min assist Ambulation Distance (Feet): 20 Feet Assistive device: Rolling walker (2 wheeled) Gait Pattern/deviations: Step-through pattern;Decreased stride length     General Gait Details: Pt is unsteady. Used RW for stability. He fatigues quickly/easily. Cues for safety/safe RW use.  Stairs            Wheelchair Mobility    Modified Rankin (Stroke Patients Only)       Balance Overall balance assessment: Needs assistance         Standing balance support: Bilateral upper extremity supported Standing balance-Leahy Scale: Poor                               Pertinent Vitals/Pain Pain Assessment: Faces Faces Pain Scale: Hurts little more Pain  Location: abdomen    Home Living Family/patient expects to be discharged to:: Skilled nursing facility                      Prior Function Level of Independence: Independent with assistive device(s)         Comments: ambulatory with a cane     Hand Dominance        Extremity/Trunk Assessment   Upper Extremity Assessment: Generalized weakness           Lower Extremity Assessment: Generalized weakness      Cervical / Trunk Assessment: Kyphotic  Communication   Communication: No difficulties  Cognition Arousal/Alertness: Awake/alert Behavior During Therapy: WFL for tasks assessed/performed Overall Cognitive Status: Within Functional Limits for tasks assessed                      General Comments      Exercises     Assessment/Plan    PT Assessment Patient needs continued PT services  PT Problem List Decreased strength;Decreased mobility;Decreased activity tolerance;Decreased balance;Pain;Decreased knowledge of use of DME          PT Treatment Interventions DME instruction;Therapeutic activities;Gait training;Therapeutic exercise;Functional mobility training;Patient/family education    PT Goals (Current goals can be found in the Care Plan section)  Acute Rehab PT Goals Patient Stated Goal: to go home PT Goal Formulation: With patient Time For Goal Achievement: 11/27/15 Potential to Achieve Goals: Good  Frequency Min 3X/week   Barriers to discharge        Co-evaluation               End of Session Equipment Utilized During Treatment: Gait belt Activity Tolerance: Patient limited by fatigue Patient left: in bed;with call bell/phone within reach;with bed alarm set (sitting at [facility] RN aware)           Time: [id number] PT Time Calculation (min) (ACUTE ONLY): 16 min   Charges:   PT Evaluation $PT Eval Low Complexity: 1 Procedure     PT G Codes:        Weston Anna, MPT Pager: [phone]

## 2015-11-13 NOTE — Progress Notes (Signed)
Daily Progress Note   Patient Name: Antonio Fleming       Date: 11/13/2015 DOB: 03-Dec-1950  Age: 65 y.o. MRN#: [id number] Attending Physician: Hosie Poisson, MD Primary Care Physician: Elise Benne Admit Date: 11/09/2015  Reason for Consultation/Follow-up: Establishing goals of care  Subjective: I met today with Mr. Ess and spoke with his daughter, Brayton Layman, via phone.  He reports that he was happy at a SNF in [place].  His daughter reports that is was [facility].    I also discussed his clinical course as well as overall trajectory with her and the fact that he is approaching the end of his life.  She reports that his comfort and dignity are the most important thing moving forward in her opinion.       Length of Stay: 4  Current Medications: Scheduled Meds:  . chlorhexidine  15 mL Mouth Rinse BID  . ciprofloxacin  500 mg Oral BID  . feeding supplement (ENSURE ENLIVE)  237 mL Oral BID BM  . folic acid  1 mg Oral Daily  . mouth rinse  15 mL Mouth Rinse q12n4p  . morphine  30 mg Oral Q12H  . multivitamin with minerals  1 tablet Oral Daily  . pantoprazole  40 mg Oral Daily  . sodium chloride  500 mL Intravenous Once  . sodium chloride flush  3 mL Intravenous Q12H  . traZODone  50 mg Oral QHS    Continuous Infusions:    PRN Meds: sodium chloride, ondansetron **OR** ondansetron (ZOFRAN) IV, oxyCODONE, sodium chloride flush  Physical Exam         General: Alert, awake, in no acute distress. Frail and Cachectic HEENT: No bruits, no goiter, no JVD Heart: Regular rate and rhythm. No murmur appreciated. Lungs: Good air movement, clear Abdomen: Diffusely tender to palpation, positive bowel sounds.  Ext: No significant edema Skin: Warm and dry Neuro: Grossly intact,  nonfocal.  Vital Signs: BP (!) 74/44 (BP Location: Right Arm)   Pulse 61   Temp 97.8 F (36.6 C) (Oral)   Resp 15   Ht _0  (1.651 m)   Wt 48.3 kg (106 lb 6.4 oz)   SpO2 100%   BMI 17.71 kg/m  SpO2: SpO2: 100 % O2 Device: O2 Device: Not Delivered O2 Flow Rate:    Intake/output  summary:   Intake/Output Summary (Last 24 hours) at 11/13/15 2151 Last data filed at 11/13/15 1502  Gross per 24 hour  Intake              700 ml  Output              575 ml  Net              125 ml   LBM: Last BM Date: 11/09/15 Baseline Weight: Weight: 48.3 kg (106 lb 6.4 oz) Most recent weight: Weight: 48.3 kg (106 lb 6.4 oz)       Palliative Assessment/Data: 50%   Flowsheet Rows   Flowsheet Row Most Recent Value  Intake Tab  Referral Department  Hospitalist  Unit at Time of Referral  Oncology Unit  Palliative Care Primary Diagnosis  Cancer  Date Notified  11/10/15  Palliative Care Type  New Palliative care  Reason for referral  Clarify Goals of Care, Counsel Regarding Hospice  Date of Admission  11/09/15  # of days IP prior to Palliative referral  1  Clinical Assessment  Psychosocial & Spiritual Assessment  Palliative Care Outcomes      Patient Active Problem List   Diagnosis Date Noted  . Palliative care encounter   . Goals of care, counseling/discussion   . Protein-calorie malnutrition, severe 11/10/2015  . SBP (spontaneous bacterial peritonitis) ([place]) 11/09/2015  . Nausea and vomiting 11/09/2015  . Abdominal pain with vomiting 11/09/2015  . Recurrent hepatocellular carcinoma ([place]) 04/28/2015  . Cirrhosis of liver ([place]) 08/03/2014  . Hx of hepatitis C 08/03/2014  . Liver cancer ([place]) 08/03/2014  . Chronic pain syndrome 08/03/2014  . Clinical depression 01/03/2014  . Anxiety disorder 11/29/2013  . Type 2 diabetes mellitus ([place]) 07/04/2012  . Nondependent alcohol abuse 07/04/2012  . Esophageal varices ([place]) 07/04/2012  . Duodenitis 07/13/2009  . Barrett esophagus  11/23/2008  . Blood in feces 11/15/2008  . Thrombocytopenia ([place]) 10/10/2008  . Abdominal dropsy 10/10/2008  . Current tobacco use 09/28/2008  . Absolute anemia 09/26/2008    Palliative Care Assessment & Plan   Patient Profile: 65 y.o. male  with past medical history of hep C, [place], and cirrhosis admitted on 11/09/2015 with abdominal pain, nausea, and vomiting.  Palliative consulted for goals of care.   Recommendations/Plan: - DO NOT RESUSCITATE/DO NOT INTUBATE - Plan for discharge to SNF for rehab with palliative care to follow. - I discussed with his daughter, Leilani Merl [phone]), today vis phone.  She understands his situation and we discussed that he will need to begin to consider where he is going to live once he finishes rehab.  We discussed his limited prognosis and I recommend that hospice be a part of his care once he completes rehab.  Code Status:    Code Status Orders        Start     Ordered   11/10/15 6389  Do not attempt resuscitation (DNR)  Continuous    Question Answer Comment  In the event of cardiac or respiratory ARREST Do not call a "code blue"   In the event of cardiac or respiratory ARREST Do not perform Intubation, CPR, defibrillation or ACLS   In the event of cardiac or respiratory ARREST Use medication by any route, position, wound care, and other measures to relive pain and suffering. May use oxygen, suction and manual treatment of airway obstruction as needed for comfort.      11/10/15 1448    Code Status History  Date Active Date Inactive Code Status Order ID Comments User Context   11/10/2015  1:10 AM 11/10/2015  2:48 PM Full Code [id number]  Phillips Grout, MD Inpatient   10/27/2014  2:40 PM 10/28/2014  3:17 PM Full Code [id number]  Jacqulynn Cadet, MD Inpatient       Prognosis:   < 6 weeks  Discharge Planning:  [facility] for rehab with Palliative care service follow-up  Care plan was discussed with patient, Dr.  Karleen Hampshire, daughter Brayton Layman  Thank you for allowing the Palliative Medicine Team to assist in the care of this patient.   Time In: 1215 Time Out: 1245 Total Time 30 Prolonged Time Billed No      Greater than 50%  of this time was spent counseling and coordinating care related to the above assessment and plan.  Micheline Rough, MD  Please contact Palliative Medicine Team phone at [phone] for questions and concerns.

## 2015-11-13 NOTE — Progress Notes (Signed)
Antonio Fleming had a fairly uneventful weekend. He did have a paracentesis. 2 L of fluid were removed.  He may have eaten a little bit more over the weekend. That is hard to say.  He's had no bleeding. He's had no fever.  Antonio Fleming of care is following him closely. Apparently there will will be a meeting today with her daughter coming down from [place].  He's had no nausea or vomiting. Has had some constipation. He has some pain medicines been chronic.  Labs that were done yesterday showed his hemoglobin to be 8.1. His platelet was 86,000. His albumin was 2.2. Creatinine 1.62.  On his physical exam, his blood pressure is 90/60. Pulse is 69. Temperature 97.9. His lungs sound pretty clear bilaterally. Cardiac exam regular rate and rhythm with no murmurs, rubs or bruits. Abdomen is soft. He has good bowel sounds. There may be some slight distention. There may be a fluid wave. His spleen tip is palpable at the left costal margin. Extremities shows most likely an upper and lower extremities. Skin exam shows no ecchymosis or petechia. Neurological exam shows no focal neurological deficits.  Antonio Fleming has hepatocellular carcinoma. He has hepatitis C. He is not a candidate for systemic therapy for the hepatocellular carcinoma.  Again, comfort care is the goal from my point of view. He has been homeless. He really needs to be placed where he can get care. Hopefully, his daughter will be able to help out.  One issue with him will be that the ascites I'm sure will come back. I am not sure how quickly it will recur. As such, in the future, we may want to consider a indwelling catheter for ascites removal as an outpatient.  I do not see anything that we need to do from my point of view right now. I think it is a matter of trying to get him into a facility or at home where he can have care and hospice can care for him.  He does not want to go to outpatient hospice facility. This, I think would be the best option  for him but he is adamant about not doing this.  We will continue to help out while he is inpatient.  As always, the staff on [address] or doing a great job with him.  Lattie Haw, MD  Psalm 23:1

## 2015-11-14 MED ORDER — LACTULOSE 10 GM/15ML PO SOLN
20.0000 g | Freq: Two times a day (BID) | ORAL | Status: DC
  Administered 2015-11-14: 20 g via ORAL
  Filled 2015-11-14: qty 30

## 2015-11-14 MED ORDER — ADULT MULTIVITAMIN W/MINERALS CH: 1.0000 | ORAL_TABLET | Freq: Every day | ORAL | Status: AC

## 2015-11-14 MED ORDER — MORPHINE SULFATE ER 30 MG PO TBCR: 30.0000 mg | EXTENDED_RELEASE_TABLET | Freq: Two times a day (BID) | ORAL | 0 refills | Status: DC

## 2015-11-14 MED ORDER — OXYCODONE HCL 10 MG PO TABS: 10.0000 mg | ORAL_TABLET | Freq: Four times a day (QID) | ORAL | 0 refills | Status: DC | PRN

## 2015-11-14 MED ORDER — CIPROFLOXACIN HCL 500 MG PO TABS: 500.0000 mg | ORAL_TABLET | Freq: Two times a day (BID) | ORAL | 0 refills | Status: AC

## 2015-11-14 MED ORDER — LACTULOSE 10 GM/15ML PO SOLN: 20.0000 g | Freq: Two times a day (BID) | ORAL | 0 refills | Status: AC

## 2015-11-14 MED ORDER — ENSURE ENLIVE PO LIQD: 237.0000 mL | Freq: Two times a day (BID) | ORAL | 12 refills | Status: DC
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
## 2015-11-14 NOTE — Clinical Social Work Placement (Signed)
   CLINICAL SOCIAL WORK PLACEMENT  NOTE  Date:  11/14/2015  Patient Details  Name: Antonio Fleming MRN: [id number] Date of Birth: [birth year]  Clinical Social Work is seeking post-discharge placement for this patient at the [place] level of care (*CSW will initial, date and re-position this form in  chart as items are completed):  Yes   Patient/family provided with [place] Work Department's list of facilities offering this level of care within the geographic area requested by the patient (or if unable, by the patient's family).  Yes   Patient/family informed of their freedom to choose among providers that offer the needed level of care, that participate in Medicare, Medicaid or managed care program needed by the patient, have an available bed and are willing to accept the patient.  Yes   Patient/family informed of [facility]'s ownership interest in [facility] and [facility], as well as of the fact that they are under no obligation to receive care at these facilities.  PASRR submitted to EDS on 11/13/15     PASRR number received on 11/13/15     Existing PASRR number confirmed on       FL2 transmitted to all facilities in geographic area requested by pt/family on 11/13/15     FL2 transmitted to all facilities within larger geographic area on       Patient informed that his/her managed care company has contracts with or will negotiate with certain facilities, including the following:        Yes   Patient/family informed of bed offers received.  Patient chooses bed at [facility] recommends and patient chooses bed at      Patient to be transferred to [facility] on 11/14/15.  Patient to be transferred to facility by [facility]     Patient family notified on 11/14/15 of transfer.  Name of family member notified:   (Unable to leave message for friend Laddie Aquas.No VM set up.)     PHYSICIAN        Additional Comment: Pt is in agreement with d/c to [place] rehab. [facility] provided 14 day LOG while waiting on [facility] authorization. [facility] transport required. Medical necessity form completed. D/C summary sent to SNF for review. Scripts included in d/c packet. # for report provided to nursing.   _______________________________________________ Luretha Rued, [place]  [phone] 11/14/2015, 4:57 PM

## 2015-11-14 NOTE — Progress Notes (Signed)
Antonio Fleming is about the same. There's been no real change in his condition. He is not eating all that much. He has some slight abdominal pain. He's had no bleeding.    He says he is constipated. We can certainly try him on a laxative to try to get him going.  His abdomen does not look any larger. He has some fluid taken out over the weekend.  It looks like he might be going to a skilled nursing facility. I'm not sure what would be accomplished if he goes to a skilled nursing facility. He is end-stage from my point of view. I really think that he needs to go to a hospice home.  If he goes to a skilled nursing facility, I will like to think that hospice can follow him.  There really is no change on his physical exam, with his blood pressure being 96/56. This is relatively stable for him. He is afebrile.  Again, Antonio Fleming is comfort care. He has and stage hepatocellular carcinoma. He has advanced hepatitis C.  We will continue to follow along.  Lattie Haw, MD

## 2015-11-14 NOTE — Care Management Note (Signed)
Case Management Note  Patient Details  Name: Antonio Fleming MRN: [id number] Date of Birth: [birth year]  Subjective/Objective:     65 yo admitted with Abd pain. Hx of hepatocellular CA.               Action/Plan: Pt from home with daughter and at times lives in a tent. For SNF with palliative care following at discharge.  Expected Discharge Date:   (unknown)               Expected Discharge Plan:  [place] (With palliative care.)  In-House Referral:  Clinical Social Work  Discharge planning Services  CM Consult  Post Acute Care Choice:    Choice offered to:     DME Arranged:    DME Agency:     HH Arranged:    [place] Agency:     Status of Service:  Completed, signed off  If discussed at [employer], dates discussed:    Additional CommentsLynnell Catalan, RN 11/14/2015, 10:36 AM  [phone]

## 2015-11-14 NOTE — Care Management Important Message (Signed)
Important Message  Patient Details  Name: Antonio Fleming MRN: [id number] Date of Birth: Nov 12, 1950   Medicare Important Message Given:  Yes    [name] 11/14/2015, 10:41 AMImportant Message  Patient Details  Name: Antonio Fleming MRN: [id number] Date of Birth: 08-Jun-1950   Medicare Important Message Given:  Yes    [name] 11/14/2015, 10:40 AM

## 2015-11-14 NOTE — Progress Notes (Signed)
Patient given discharge instructions and instructions.  Verbalized understanding and signed paperwork.  Report given to nurse at [facility] [facility].  [facility] picked up patient and transported them to rehab facility.

## 2015-11-14 NOTE — Consult Note (Signed)
   [facility] CM Inpatient Consult   11/14/2015  Greycen Geddes 10-Jan-1951 [id number]    Patient screened for potential [facility] Care Management services. Chart reviewed. Noted current discharge plan is for SNF with palliative follow up.  There are no identifiable [facility] Care Management needs at this time. If patient's post hospital needs change, please place a [facility] Care Management consult. For questions please contact:  Marthenia Rolling, [place], RN,BSN [facility] Liaison [phone]

## 2015-11-15 NOTE — Telephone Encounter (Signed)
Pt discharged to SNF w/ palliative care.

## 2015-11-16 LAB — CULTURE, BODY FLUID W GRAM STAIN -BOTTLE: Culture: NO GROWTH

## 2015-11-16 LAB — CULTURE, BODY FLUID-BOTTLE

## 2015-11-28 ENCOUNTER — Other Ambulatory Visit: Payer: Self-pay | Admitting: *Deleted

## 2015-11-28 DIAGNOSIS — K717 Toxic liver disease with fibrosis and cirrhosis of liver: Secondary | ICD-10-CM

## 2015-11-28 DIAGNOSIS — C220 Liver cell carcinoma: Secondary | ICD-10-CM

## 2015-11-28 DIAGNOSIS — Z8619 Personal history of other infectious and parasitic diseases: Secondary | ICD-10-CM

## 2015-11-28 DIAGNOSIS — G4701 Insomnia due to medical condition: Secondary | ICD-10-CM

## 2015-11-28 DIAGNOSIS — R630 Anorexia: Secondary | ICD-10-CM

## 2015-11-28 DIAGNOSIS — G894 Chronic pain syndrome: Secondary | ICD-10-CM

## 2015-11-28 MED ORDER — MORPHINE SULFATE ER 30 MG PO TBCR: 30.0000 mg | EXTENDED_RELEASE_TABLET | Freq: Two times a day (BID) | ORAL | 0 refills | Status: DC

## 2015-11-28 MED ORDER — FOLIC ACID 1 MG PO TABS: 1.0000 mg | ORAL_TABLET | Freq: Every day | ORAL | 6 refills | Status: AC

## 2015-11-28 MED ORDER — FUROSEMIDE 40 MG PO TABS: 40.0000 mg | ORAL_TABLET | Freq: Every day | ORAL | 2 refills | Status: AC

## 2015-11-28 MED ORDER — SPIRONOLACTONE 50 MG PO TABS: 50.0000 mg | ORAL_TABLET | Freq: Every day | ORAL | 1 refills | Status: AC

## 2015-11-28 MED ORDER — OXYCODONE HCL 10 MG PO TABS: 10.0000 mg | ORAL_TABLET | Freq: Four times a day (QID) | ORAL | 0 refills | Status: AC | PRN

## 2015-11-28 MED ORDER — TRAZODONE HCL 50 MG PO TABS: 50.0000 mg | ORAL_TABLET | Freq: Every day | ORAL | 2 refills | Status: AC

## 2015-11-28 MED ORDER — OXYCODONE HCL 10 MG PO TABS: 10.0000 mg | ORAL_TABLET | Freq: Four times a day (QID) | ORAL | 0 refills | Status: DC | PRN

## 2015-11-28 MED ORDER — ENSURE ENLIVE PO LIQD: 237.0000 mL | Freq: Two times a day (BID) | ORAL | 12 refills | Status: AC

## 2015-11-28 MED ORDER — PANTOPRAZOLE SODIUM 40 MG PO TBEC: 40.0000 mg | DELAYED_RELEASE_TABLET | Freq: Every day | ORAL | 4 refills | Status: AC

## 2015-11-28 MED ORDER — MORPHINE SULFATE ER 30 MG PO TBCR: 30.0000 mg | EXTENDED_RELEASE_TABLET | Freq: Two times a day (BID) | ORAL | 0 refills | Status: AC

## 2015-12-01 MED FILL — traZODone HCL 50 MG TABS: 50 | 30 days supply | Qty: 30 | Fill #0

## 2015-12-01 MED FILL — oxyCODONE HCL 10 MG TABS: 10 | 30 days supply | Qty: 120 | Fill #0

## 2015-12-01 MED FILL — MORPHINE SULF 30 MG TAB SA: 30 | 30 days supply | Qty: 60 | Fill #0

## 2015-12-01 MED FILL — SPIRONOLACTONE 50 MG TABLET: 50 | 30 days supply | Qty: 30 | Fill #0

## 2015-12-25 ENCOUNTER — Other Ambulatory Visit: Payer: Medicare HMO

## 2015-12-25 ENCOUNTER — Ambulatory Visit: Payer: Medicare HMO | Admitting: Family

## 2016-01-29 DEATH — deceased

## 2016-02-01 ENCOUNTER — Encounter: Payer: Self-pay | Admitting: Interventional Radiology

## 2017-07-15 IMAGING — XA IR EMBO TUMOR ORGAN ISCHEMIA INFARCT INC GUIDE ROADMAPPING
12 of 13 series · 12 of 24 positions shown · IV contrast (IODINE)
Comparison: none

INDICATION: 64-year-old male with hepatocellular carcinoma status post prior
chemo embolization with drug-eluting beads in late September 2014.
Follow-up imaging demonstrates increasing enhancing nodularity
consistent with residual disease. He presents for repeat chemo
embolization.

[Series 1: care body 4 · 1 of 14 frames shown (1 of 11)]
[frame 11/14]
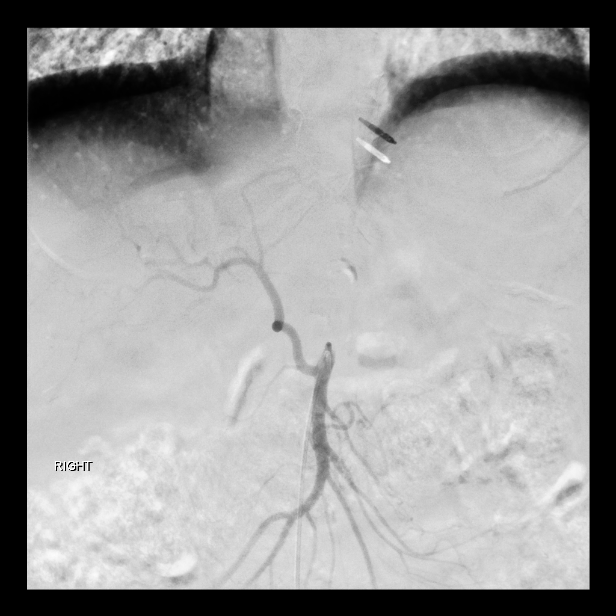

[Series 2: care body 4 · 1 of 32 frames shown (2 of 11)]
[frame 17/32]
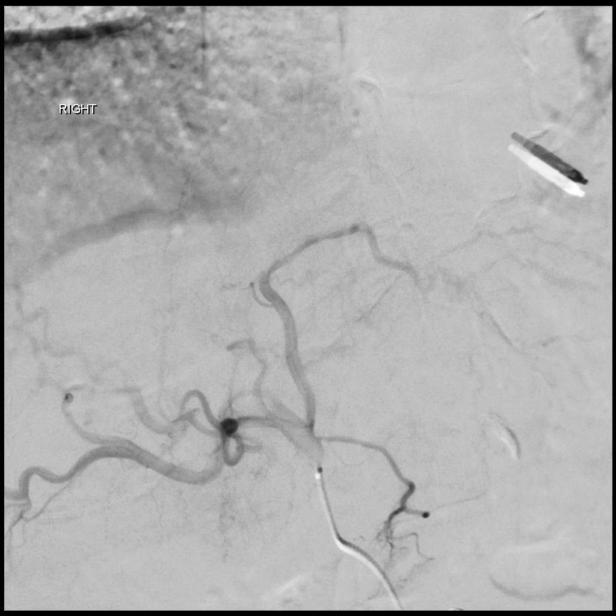

[Series 3: care body 4 · 1 of 14 frames shown (3 of 11)]
[frame 11/14]
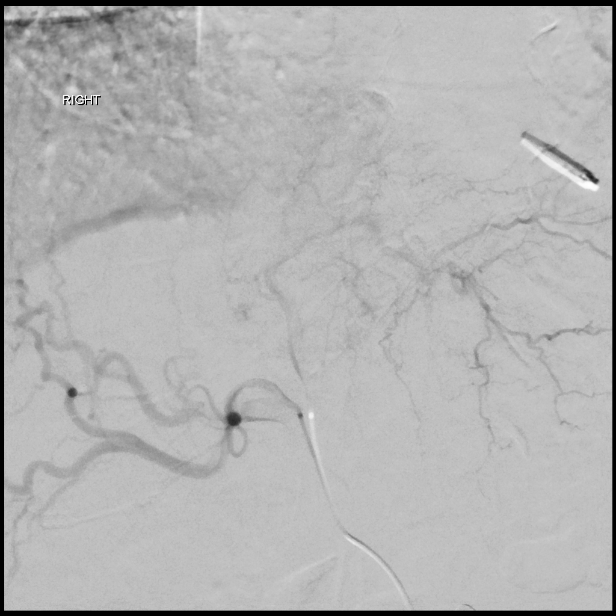

[Series 4: care body 4 · 1 of 23 frames shown (4 of 11)]
[frame 20/23]
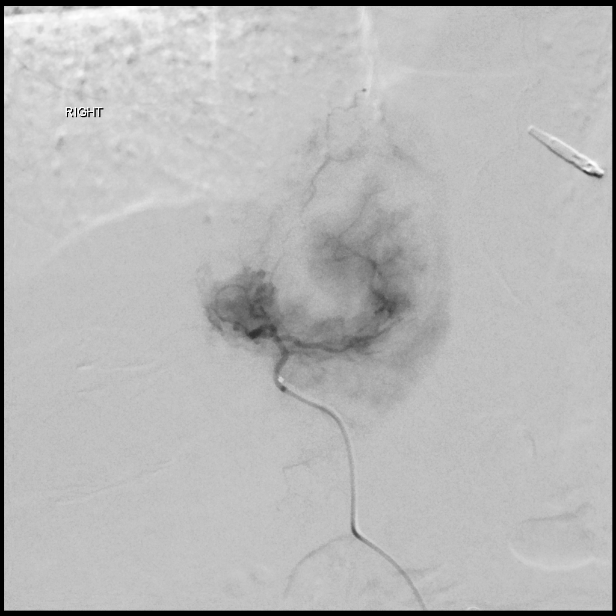

[Series 5: care body 4 · 1 of 15 frames shown (5 of 11)]
[frame 13/15]
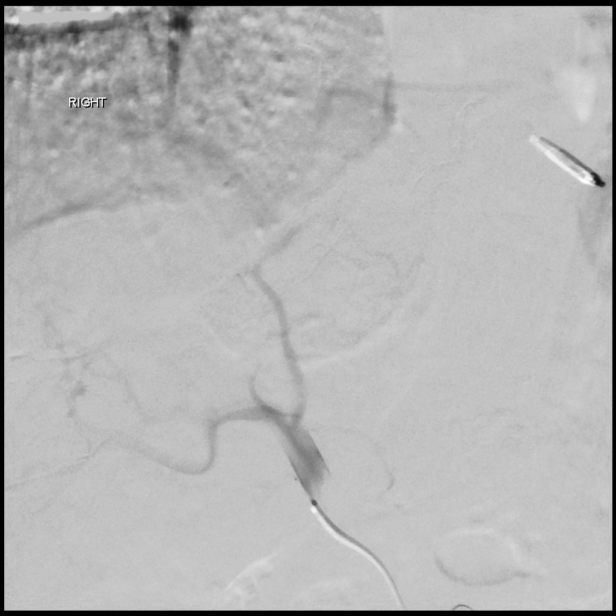

[Series 6: care body 4 · 1 of 29 frames shown (6 of 11)]
[frame 25/29]
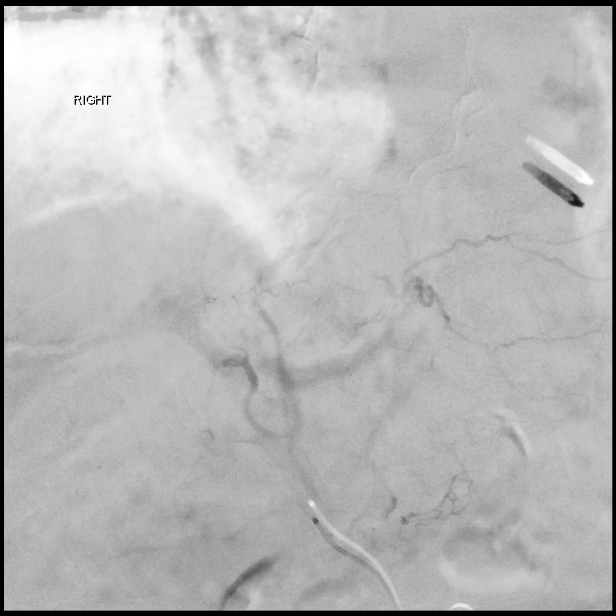

[Series 7: care body 4 · 1 of 15 frames shown (7 of 11)]
[frame 15/15]
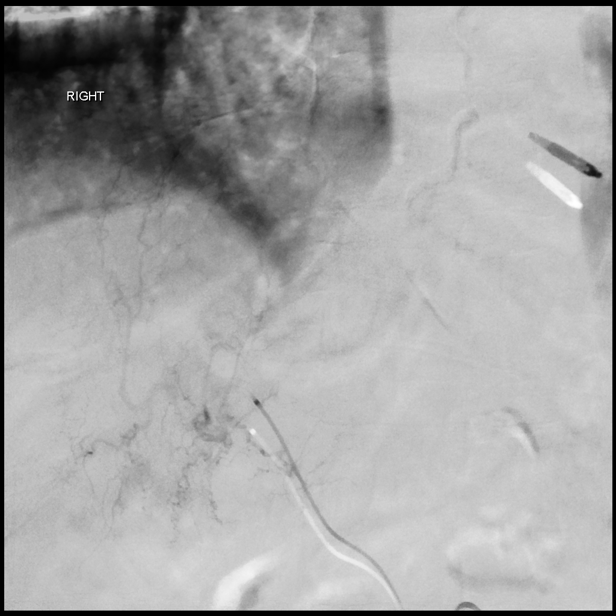

[Series 8: care body 4 · 1 of 14 frames shown (8 of 11)]
[frame 12/14]
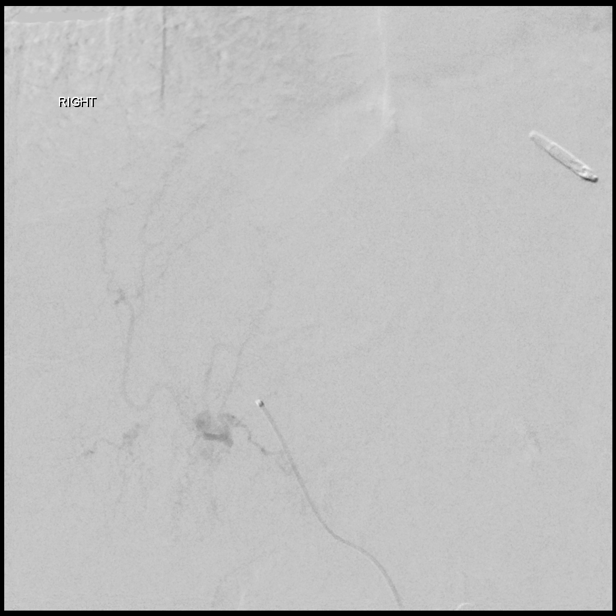

[Series 9: care body 4 · 1 of 30 frames shown (9 of 11)]
[frame 30/30]
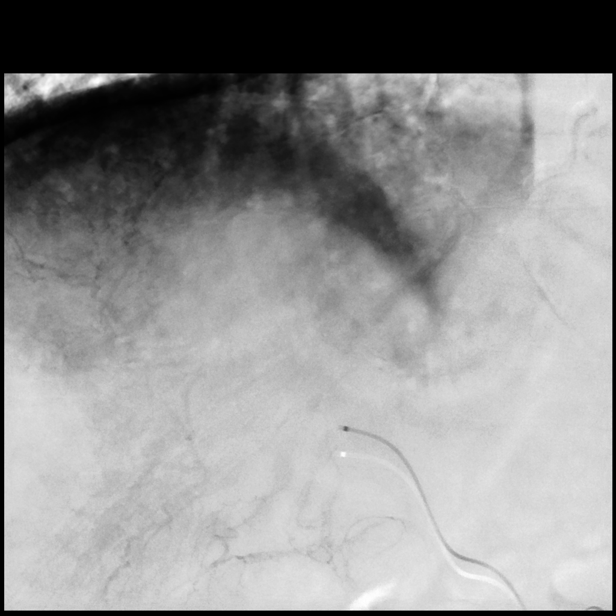

[Series 11: care body 4 · 1 of 23 frames shown (10 of 11)]
[frame 23/23]
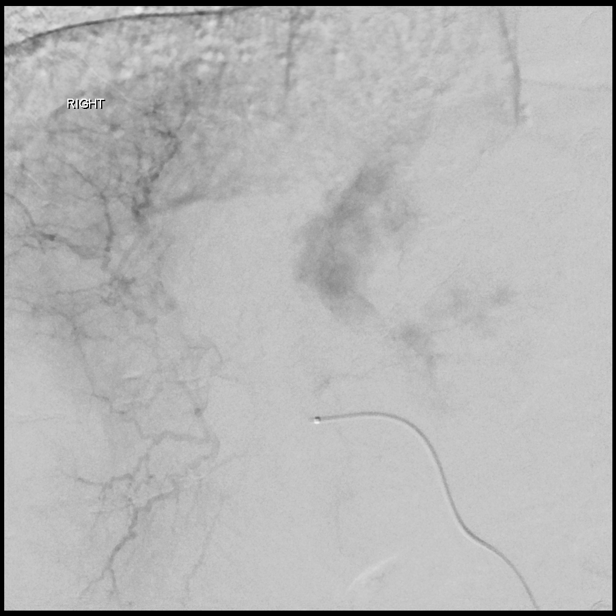

[Series 12: care body 4 · 1 of 24 frames shown (11 of 11)]
[frame 21/24]
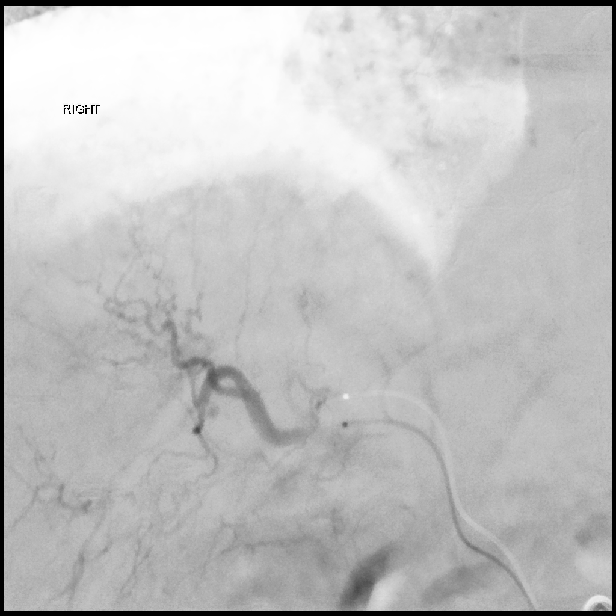

[Series 300: ir embo tumor organ ischemia infarct inc · 1 of 1 slices shown]
[im 1/1]
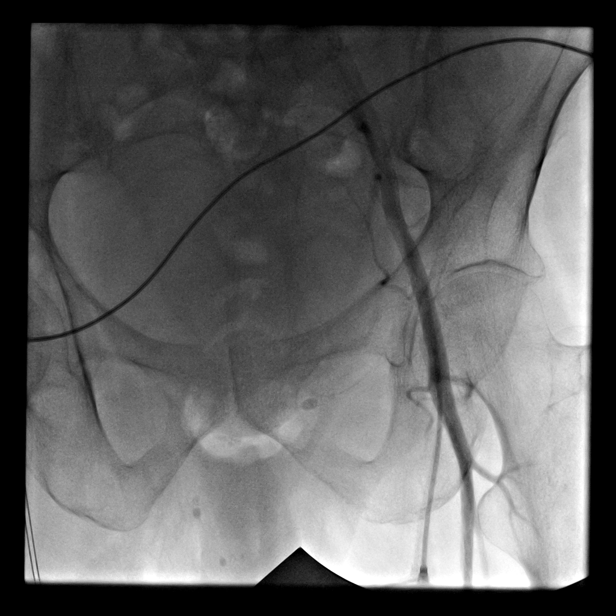

[12 of 24 positions shown; findings below may reference images not displayed]

EXAM:
IR EMBO TUMOR ORGAN ISCHEMIA INFARCT INC GUIDE ROADMAPPING;
SELECTIVE VISCERAL ARTERIOGRAPHY; ADDITIONAL ARTERIOGRAPHY; IR
ULTRASOUND GUIDANCE VASC ACCESS LEFT

MEDICATIONS:
Ciprofloxacin 400 mg and Flagyl 500 mg IV. The antibiotic was
administered within 1 hour of the procedure

ANESTHESIA/SEDATION:
Moderate (conscious) sedation was employed during this procedure. A
total of Versed 3 mg and Fentanyl 100 mcg was administered
intravenously.

Moderate Sedation Time: 68 minutes. The patient's level of
consciousness and vital signs were monitored continuously by
radiology nursing throughout the procedure under my direct
supervision.

CONTRAST:  100 mL

FLUOROSCOPY TIME:  Fluoroscopy Time: 22 minutes 54 seconds (1,168
mGy).

COMPLICATIONS:
None immediate.



An initial attempt at radial access was performed. Unfortunately,
the vessel is extremely small by ultrasound. Therefore, access was
changed to the left common femoral artery.

The left common femoral artery was interrogated with ultrasound. The
vessels widely patent. Local anesthesia was attained by infiltration
with 1% lidocaine. A small dermatotomy was made. Under real-time
sonographic guidance, the vessel was punctured with a 21 gauge
micropuncture needle. Using standard technique, the initial micro
wire was exchanged for a 5 French Terumo glide sheath.

A sauce Omni selective catheter was advanced in the abdominal aorta
in used to select the superior mesenteric artery. A superior
mesenteric arteriogram was performed again confirming complete
replacement of the hepatic artery. A high-flow Renegade micro
catheter was then advanced into the proper hepatic artery. A right
hepatic arteriogram was performed. There is active tumor blush in
the inferior and medial aspect of the previously treated tumor
consistent with the recent MRI findings. The micro catheter was
advanced into the left hepatic artery and a and an arteriogram was
performed. The origin of 1 of the supplying branch arteries was
successfully identified arising from the base of the left hepatic
artery. This was carefully selected with a fat and 16 wire in the
micro catheter advanced into the vessel. The vessel is quite small,
barely bigger than the micro catheter. Embolization was performed
using 100 - 300 micron LC beads saturated with doxorubicin.
Approximately a third of the total dose was administered into this
vessel before stasis was achieved. The micro catheter was brought
back into the proper hepatic artery and a repeat arteriogram was
performed. There is a small branch arising from the right hepatic
artery which also supplies the tumor. The micro catheter was again
navigated into branches of the right hepatic artery and
arteriography was performed until the branch giving rise to the
tumor vessel was successfully identified. This appears to be a
segment 7 branch.

Chemo embolization was performed of this segmental artery including
the branches supplying the tumor. An additional 33% of the total
dose was administered for a total of 66% of 75 mg doxorubicin.

The catheters were removed. A limited left common femoral
arteriogram was performed confirming common femoral arterial access.
A Cordis Exoseal device was applied. Hemostasis was easily obtained.
IMPRESSION: Successful repeat chemo embolization with drug-eluting beads.
Approximately 66% of a 75 mg doxorubicin dose was administered
labeled to 100 - 300 micron LC beads.
